# Patient Record
Sex: Female | Born: 1980
Health system: Southern US, Community
[De-identification: ages and names within clinical notes are randomized; demographics above are authoritative.]

## PROBLEM LIST (undated history)

## (undated) DIAGNOSIS — O009 Unspecified ectopic pregnancy without intrauterine pregnancy: Secondary | ICD-10-CM

## (undated) DIAGNOSIS — G44209 Tension-type headache, unspecified, not intractable: Secondary | ICD-10-CM

## (undated) DIAGNOSIS — E782 Mixed hyperlipidemia: Principal | ICD-10-CM

## (undated) DIAGNOSIS — E559 Vitamin D deficiency, unspecified: Secondary | ICD-10-CM

## (undated) HISTORY — PX: NO PAST SURGERIES: SHX2092

## (undated) HISTORY — PX: BREAST SURGERY: SHX581

---

## 2015-01-29 ENCOUNTER — Ambulatory Visit (INDEPENDENT_AMBULATORY_CARE_PROVIDER_SITE_OTHER): Payer: 59 | Admitting: Family Medicine

## 2015-01-29 VITALS — BP 120/70 | HR 86 | Temp 98.6°F | Resp 18 | Ht 62.5 in | Wt 108.1 lb

## 2015-01-29 DIAGNOSIS — R350 Frequency of micturition: Secondary | ICD-10-CM | POA: Diagnosis not present

## 2015-01-29 LAB — POCT URINALYSIS DIPSTICK
Bilirubin, UA: NEGATIVE
GLUCOSE UA: NEGATIVE
Ketones, UA: NEGATIVE
Nitrite, UA: NEGATIVE
PROTEIN UA: NEGATIVE
RBC UA: NEGATIVE
SPEC GRAV UA: 1.015
Urobilinogen, UA: 0.2
pH, UA: 6

## 2015-01-29 LAB — POCT UA - MICROSCOPIC ONLY
Casts, Ur, LPF, POC: NEGATIVE
Crystals, Ur, HPF, POC: NEGATIVE
Mucus, UA: NEGATIVE
Yeast, UA: NEGATIVE

## 2015-01-29 LAB — POCT URINE PREGNANCY: Preg Test, Ur: NEGATIVE

## 2015-01-29 MED ORDER — CIPROFLOXACIN HCL 500 MG PO TABS: 500.0000 mg | ORAL_TABLET | Freq: Two times a day (BID) | ORAL | Status: DC

## 2015-01-29 NOTE — Progress Notes (Addendum)
This chart was scribed for Dr. Elvina Sidle, MD by Jarvis Morgan, Medical Scribe. This patient was seen in Room 11 and the patient's care was started at 2:57 PM.  Patient ID: Grace Powell MRN: [id number], DOB: 1981/06/09, 34 y.o. Date of Encounter: 01/29/2015, 2:52 PM  Primary Physician: No primary care provider on file.  Chief Complaint:  Chief Complaint  Patient presents with   Urinary Tract Infection    C/O urinary frequency & pain x 1 week     HPI: 34 y.o. year old female with history below presents with urinary frequency for 1 week. She is having associated dysuria and suprapubic abdominal pain. Pt is not currently on BC medication. Her LNMP was 01/05/15. She denies any chance she could be pregnant. Pt denies any fever, chills, nausea, vomiting or back pain.  Pt is from [place], [place]. She works in a warehouse  No past medical history on file.   Home Meds: Prior to Admission medications   Not on File    Allergies: No Known Allergies  History   Social History   Marital Status: Single    Spouse Name: N/A   Number of Children: N/A   Years of Education: N/A   Occupational History   Not on file.   Social History Main Topics   Smoking status: Never Smoker    Smokeless tobacco: Not on file   Alcohol Use: No   Drug Use: No   Sexual Activity: Not on file   Other Topics Concern   Not on file   Social History Narrative   No narrative on file     Review of Systems: Constitutional: negative for chills, fever, night sweats, weight changes, or fatigue  HEENT: negative for vision changes, hearing loss, congestion, rhinorrhea, ST, epistaxis, or sinus pressure Cardiovascular: negative for chest pain or palpitations Respiratory: negative for hemoptysis, wheezing, shortness of breath, or cough Abdominal: positive for suprapubic abdominal pain. Negative for nausea, vomiting, diarrhea, or constipation Dermatological: negative for rash Neurologic:  negative for headache, dizziness, or syncope GU: positive for urinary frequency and dysuria All other systems reviewed and are otherwise negative with the exception to those above and in the HPI.   Physical Exam: Blood pressure 120/70, pulse 86, temperature 98.6 F (37 C), temperature source Oral, resp. rate 18, height 5' 2.5" (1.588 m), weight 108 lb 2 oz (49.045 kg), last menstrual period 01/10/2015, SpO2 95 %., Body mass index is 19.45 kg/(m^2). General: Well developed, well nourished, in no acute distress. Head: Normocephalic, atraumatic, eyes without discharge, sclera non-icteric, nares are without discharge. Bilateral auditory canals clear, TM's are without perforation, pearly grey and translucent with reflective cone of light bilaterally. Oral cavity moist, posterior pharynx without exudate, erythema, peritonsillar abscess, or post nasal drip.  Neck: Supple. No thyromegaly. Full ROM. No lymphadenopathy. Lungs: Clear bilaterally to auscultation without wheezes, rales, or rhonchi. Breathing is unlabored. Heart: RRR with S1 S2. No murmurs, rubs, or gallops appreciated. Abdomen: Soft, non-distended with normoactive bowel sounds. Mild suprapubic tenderness. No hepatomegaly. No rebound/guarding. No obvious abdominal masses. Msk:  Strength and tone normal for age. Extremities/Skin: Warm and dry. No clubbing or cyanosis. No edema. No rashes or suspicious lesions. Neuro: Alert and oriented X 3. Moves all extremities spontaneously. Gait is normal. CNII-XII grossly in tact. Psych:  Responds to questions appropriately with a normal affect.   Results for orders placed or performed in visit on 01/29/15  POCT UA - Microscopic Only  Result Value Ref Range  WBC, Ur, HPF, POC 5-10    RBC, urine, microscopic 0-1    Bacteria, U Microscopic 3+    Mucus, UA negative    Epithelial cells, urine per micros 5-10    Crystals, Ur, HPF, POC negative    Casts, Ur, LPF, POC negative    Yeast, UA negative     Amorphous moderate   Urinalysis Dipstick  Result Value Ref Range   Color, UA yellow    Clarity, UA clear    Glucose, UA negative    Bilirubin, UA negative    Ketones, UA negative    Spec Grav, UA 1.015    Blood, UA negative    pH, UA 6.0    Protein, UA negative    Urobilinogen, UA 0.2    Nitrite, UA negative    Leukocytes, UA small (1+) (A) Negative  POCT urine pregnancy  Result Value Ref Range   Preg Test, Ur Negative Negative      ASSESSMENT AND PLAN:  34 y.o. year old female with  1. Urinary frequency    This chart was scribed in my presence and reviewed by me personally.    ICD-9-CM ICD-10-CM   1. Urinary frequency 788.41 R35.0 POCT UA - Microscopic Only     Urinalysis Dipstick     POCT urine pregnancy     Urine culture     ciprofloxacin (CIPRO) 500 MG tablet     Signed, Elvina SidleKurt Lauenstein, MD 01/29/2015 2:52 PM

## 2015-01-29 NOTE — Patient Instructions (Signed)

## 2015-02-01 LAB — URINE CULTURE: Colony Count: >=100000

## 2015-04-15 ENCOUNTER — Ambulatory Visit (INDEPENDENT_AMBULATORY_CARE_PROVIDER_SITE_OTHER): Payer: 59 | Admitting: Physician Assistant

## 2015-04-15 VITALS — BP 102/64 | HR 92 | Temp 98.4°F | Resp 18 | Ht 62.25 in | Wt 109.0 lb

## 2015-04-15 DIAGNOSIS — R0789 Other chest pain: Secondary | ICD-10-CM

## 2015-04-15 DIAGNOSIS — Z23 Encounter for immunization: Secondary | ICD-10-CM | POA: Diagnosis not present

## 2015-04-15 DIAGNOSIS — Z124 Encounter for screening for malignant neoplasm of cervix: Secondary | ICD-10-CM

## 2015-04-15 DIAGNOSIS — G479 Sleep disorder, unspecified: Secondary | ICD-10-CM | POA: Diagnosis not present

## 2015-04-15 DIAGNOSIS — Z0000 Encounter for general adult medical examination without abnormal findings: Secondary | ICD-10-CM

## 2015-04-15 NOTE — Progress Notes (Signed)
Patient ID: Grace Powell, female    DOB: [birth year], 34 y.o.   MRN: [id number]  PCP: No primary care provider on file.  Chief Complaint  Patient presents with  . Annual Exam    with pelvic exam    Subjective:   HPI: Patient presents today for her complete physical exam.   Concerns today include chest pain. She feels a pain in the middle of her chest, over the sternum, and occasionally on the left side of her chest over her heart. It occurs when she "thinks a lot", or is nervous or stressed about something. It last occurred this morning when she was stressed about something. The episodes usually last around 5 minutes. No heart palpitations. No shortness of breath.   She has no history of diabetes, hypertension, or other medical problems.  No family history of MI, stroke, or heart disease.   She is also having trouble sleeping. 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 She has a lot of trouble falling asleep, and this is a problem because she has to be up at 4:30 AM, and does not have much time to sleep as it is. She typically goes to bed about 9 pm and sleep for 90-120 minutes, and then awakens. Then she has difficulty going back to sleep, thinking about all the worries she has.  In addition to a busy schedule, she is worried that she is unmarried and without children at her age. She would like to become pregnant now, and is happy to raise a child alone if her current partner decided he did not want to be involved.  She does not wish to take medication for the problems she describes, she just wants to make sure that everything is okay.    There are no active problems to display for this patient.   History reviewed. No pertinent past medical history.   Prior to Admission medications   Not on File    No Known Allergies  History reviewed. No pertinent past surgical history.  Family History  Problem Relation Age of Onset  . Cancer Father      Social History   Social History  . Marital Status: Single    Spouse Name: N/A  . Number of Children: 0  . Years of Education: N/A   Occupational History  . Warehouse Picker     [employer]   Social History Main Topics  . Smoking status: Never Smoker   . Smokeless tobacco: None  . Alcohol Use: No  . Drug Use: No  . Sexual Activity: Not Asked   Other Topics Concern  . None   Social History Narrative   Patient is from [place]. She came to the [place] in 2013. Her family remains in [place]. She lives at home with her cousin.       Review of Systems  Constitutional: Positive for fatigue. Negative for fever, chills, diaphoresis, activity change, appetite change and unexpected weight change.  HENT: Negative for congestion, sinus pressure, sneezing, sore throat and trouble swallowing.   Eyes: Negative.   Respiratory: Negative.   Cardiovascular: Positive for chest pain. Negative for palpitations and leg swelling.  Gastrointestinal: Negative.   Endocrine: Negative.   Genitourinary: Negative.   Musculoskeletal: Positive for arthralgias (sometimes has foot pain). Negative for myalgias, back pain and joint swelling.  Skin: Negative.   Allergic/Immunologic: Negative.   Neurological: Positive for headaches (occasionally). Negative for dizziness, tremors,  seizures, syncope, facial asymmetry, speech difficulty, weakness, light-headedness and numbness.  Hematological: Negative.   Psychiatric/Behavioral: Positive for sleep disturbance. Negative for suicidal ideas, hallucinations, behavioral problems, confusion, self-injury, dysphoric mood, decreased concentration and agitation. The patient is not nervous/anxious and is not hyperactive.         Objective:  Physical Exam  Constitutional: She is oriented to person, place, and time. Vital signs are normal. She appears well-developed and well-nourished. She is active and cooperative. No distress.  BP 102/64 mmHg  Pulse  92  Temp(Src) 98.4 F (36.9 C) (Oral)  Resp 18  Ht 5' 2.25" (1.581 m)  Wt 109 lb (49.442 kg)  BMI 19.78 kg/m2  SpO2 98%  LMP 04/06/2015   HENT:  Head: Normocephalic and atraumatic.  Right Ear: Hearing, tympanic membrane, external ear and ear canal normal. No foreign bodies.  Left Ear: Hearing, tympanic membrane, external ear and ear canal normal. No foreign bodies.  Nose: Nose normal.  Mouth/Throat: Uvula is midline, oropharynx is clear and moist and mucous membranes are normal. No oral lesions. Normal dentition. No dental abscesses or uvula swelling. No oropharyngeal exudate.  Eyes: Conjunctivae, EOM and lids are normal. Pupils are equal, round, and reactive to light. Right eye exhibits no discharge. Left eye exhibits no discharge. No scleral icterus.  Fundoscopic exam:      The right eye shows no arteriolar narrowing, no AV nicking, no exudate, no hemorrhage and no papilledema.       The left eye shows no arteriolar narrowing, no AV nicking, no exudate, no hemorrhage and no papilledema.  Neck: Trachea normal, normal range of motion and full passive range of motion without pain. Neck supple. No spinous process tenderness and no muscular tenderness present. No thyroid mass and no thyromegaly present.  Cardiovascular: Normal rate, regular rhythm, normal heart sounds, intact distal pulses and normal pulses.   Pulmonary/Chest: Effort normal and breath sounds normal. She exhibits no tenderness and no retraction. Right breast exhibits no inverted nipple, no mass, no nipple discharge, no skin change and no tenderness. Left breast exhibits no inverted nipple, no mass, no nipple discharge, no skin change and no tenderness. Breasts are symmetrical.  Abdominal: Soft. Normal appearance and bowel sounds are normal. She exhibits no distension and no mass. There is no hepatosplenomegaly. There is no tenderness. There is no rigidity, no rebound, no guarding, no CVA tenderness, no tenderness at McBurney's  point and negative Murphy's sign. No hernia. Hernia confirmed negative in the right inguinal area and confirmed negative in the left inguinal area.  Genitourinary: Rectum normal, vagina normal and uterus normal. Rectal exam shows no external hemorrhoid and no fissure. No breast swelling, tenderness, discharge or bleeding. Pelvic exam was performed with patient supine. No labial fusion. There is no rash, tenderness, lesion or injury on the right labia. There is no rash, tenderness, lesion or injury on the left labia. Cervix exhibits no motion tenderness, no discharge and no friability. Right adnexum displays no mass, no tenderness and no fullness. Left adnexum displays no mass, no tenderness and no fullness. No erythema, tenderness or bleeding in the vagina. No foreign body around the vagina. No signs of injury around the vagina. No vaginal discharge found.  Musculoskeletal: She exhibits no edema or tenderness.       Cervical back: Normal.       Thoracic back: Normal.       Lumbar back: Normal.  Lymphadenopathy:       Head (right side): No tonsillar, no preauricular,  no posterior auricular and no occipital adenopathy present.       Head (left side): No tonsillar, no preauricular, no posterior auricular and no occipital adenopathy present.    She has no cervical adenopathy.    She has no axillary adenopathy.       Right: No inguinal and no supraclavicular adenopathy present.       Left: No inguinal and no supraclavicular adenopathy present.  Neurological: She is alert and oriented to person, place, and time. She has normal strength and normal reflexes. No cranial nerve deficit. She exhibits normal muscle tone. Coordination and gait normal.  Skin: Skin is warm, dry and intact. No rash noted. She is not diaphoretic. No cyanosis or erythema. Nails show no clubbing.  Psychiatric: She has a normal mood and affect. Her speech is normal and behavior is normal. Judgment and thought content normal.            Assessment & Plan:  1. Annual physical exam Age appropriate anticipatory guidance provided.  2. Need for Tdap vaccination - Tdap vaccine greater than or equal to 7yo IM  3. Screening for cervical cancer If both cytology and HPV are negative, repeat both in 5 years. - Pap IG and HPV (high risk) DNA detection  4. Sleep disturbance Discussed ways to relax, reminding her that she is inherently valuable, regardless of her marital status or motherhood.  5. Chest discomfort This appears to be anxiety related, like with the sleep disturbance. Doubt this is cardiac in nature, and she is reassured. However, if it continues, she is to RTC for additional evaluation.  Attempt to screen for anemia, metabloic disorder, HIV and check TSH failed due to unsuccessful phlebotomy.   Fernande Bras, PA-C Physician Assistant-Certified [facility]

## 2015-04-15 NOTE — Progress Notes (Signed)
Subjective:     Patient ID: Grace Powell, female   DOB: 1980/11/27, 34 y.o.   MRN: [id number] PCP: No primary care provider on file.  Chief Complaint  Patient presents with  . Annual Exam    with pelvic exam    HPI Patient presents today for her complete physical exam.   Concerns today include chest pain. She feels a pain in the middle of her chest, over the sternum, and occasionally on the left side of her chest over her heart. It occurs when she "thinks a lot", or is nervous or stressed about something. It last occurred this morning when she was stressed about something. The episodes usually last around 5 minutes. No heart palpitations. No shortness of breath.  She has no history of diabetes, hypertension, or other medical problems.  No family history of MI, stroke, or heart disease.   She is also having trouble sleeping. 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 She has a lot of trouble falling asleep, and this is a problem because she goes to bed at 11 pm and has to be up at 4:30 AM, and does not have much time to sleep as it is.   She does not wish to take medication, she just wants to make sure that everything is okay.   Review of Systems  Constitutional: Negative for fever and chills.  Eyes: Negative for visual disturbance.  Respiratory: Positive for chest tightness. Negative for shortness of breath.   Cardiovascular: Negative for chest pain.  Gastrointestinal: Negative for nausea, vomiting, abdominal pain, diarrhea and constipation.  Genitourinary: Negative for dysuria, urgency and frequency.  Musculoskeletal: Negative for arthralgias.       Foot pain, mainly after work (she works in a warehouse) but sometimes in the morning.  Skin: Negative for rash.  Neurological: Positive for headaches (Occasional). Negative for dizziness.  Psychiatric/Behavioral: Negative for sleep disturbance.     There are no active problems to display for  this patient.   Prior to Admission medications   Not on File    No Known Allergies    Objective:  Physical Exam  Constitutional: She is oriented to person, place, and time. She appears well-developed and well-nourished.  HENT:  Head: Normocephalic.  Right Ear: Tympanic membrane, external ear and ear canal normal.  Left Ear: Tympanic membrane, external ear and ear canal normal.  Nose: Nose normal.  Mouth/Throat: Uvula is midline, oropharynx is clear and moist and mucous membranes are normal.  Eyes: Pupils are equal, round, and reactive to light.  Neck: Normal range of motion. Neck supple.  Cardiovascular: Normal rate and regular rhythm.   Pulmonary/Chest: Effort normal and breath sounds normal.  Neurological: She is alert and oriented to person, place, and time. She has normal reflexes.  Skin: Skin is warm and dry.  Psychiatric: She has a normal mood and affect. Her behavior is normal. Thought content normal.     BP 102/64 mmHg  Pulse 92  Temp(Src) 98.4 F (36.9 C) (Oral)  Resp 18  Ht 5' 2.25" (1.581 m)  Wt 109 lb (49.442 kg)  BMI 19.78 kg/m2  SpO2 98%  LMP 04/06/2015   Assessment & Plan:  1. Annual physical exam No abnormal findings.   2. Need for Tdap vaccination - Tdap vaccine greater than or equal to 7yo IM  3. Screening for cervical cancer - Pap IG and HPV (high risk) DNA detection  4. Sleep disturbance This is likely  anxiety related. Counseled patient to try to decrease worrying! She will RTC if she decides she would like medication to help her sleep.   5. Chest discomfort Likely anxiety related. She is to RTC if her symptoms do not get better or worsen.   We were unable to obtain blood specimens for routine labs (CBC, CMET, HIV, Lipid panel, TSH). We will try again at a later date, especially if her symptoms of insomnia/chest discomfort continue.    Amber D. Race, PA-S Physician Assistant Student [facility]

## 2015-04-15 NOTE — Patient Instructions (Signed)
Keeping You Healthy  Get These Tests 1. Blood Pressure- Have your blood pressure checked once a year by your health care provider.  Normal blood pressure is 120/80. 2. Weight- Have your body mass index (BMI) calculated to screen for obesity.  BMI is measure of body fat based on height and weight.  You can also calculate your own BMI at https://www.west-esparza.com/. 3. Cholesterol- Have your cholesterol checked every 5 years starting at age 35 then yearly starting at age 72. 4. Chlamydia, HIV, and other sexually transmitted diseases- Get screened every year until age 78, then within three months of each new sexual provider. 5. Pap Test - Every 1-5 years; discuss with your health care provider. 6. Mammogram- Every 1-2 years starting at age 76--50  Take these medicines  Calcium with Vitamin D-Your body needs 1200 mg of Calcium each day and [phone] IU of Vitamin D daily.  Your body can only absorb 500 mg of Calcium at a time so Calcium must be taken in 2 or 3 divided doses throughout the day.  Multivitamin with folic acid- Once daily if it is possible for you to become pregnant.  Get these Immunizations  Gardasil-Series of three doses; prevents HPV related illness such as genital warts and cervical cancer.  Menactra-Single dose; prevents meningitis.  Tetanus shot- Every 10 years.  Flu shot-Every year.  Take these steps 1. Do not smoke-Your healthcare provider can help you quit.  For tips on how to quit go to www.smokefree.gov or call 1-800 QUITNOW. 2. Be physically active- Exercise 5 days a week for at least 30 minutes.  If you are not already physically active, start slow and gradually work up to 30 minutes of moderate physical activity.  Examples of moderate activity include walking briskly, dancing, swimming, bicycling, etc. 3. Breast Cancer- A self breast exam every month is important for early detection of breast cancer.  For more information and instruction on self breast exams, ask your  healthcare provider or [place]. 4. Eat a healthy diet- Eat a variety of healthy foods such as fruits, vegetables, whole grains, low fat milk, low fat cheeses, yogurt, lean meats, poultry and fish, beans, nuts, tofu, etc.  For more information go to www. Thenutritionsource.org 5. Drink alcohol in moderation- Limit alcohol intake to one drink or less per day. Never drink and drive. 6. Depression- Your emotional health is as important as your physical health.  If you're feeling down or losing interest in things you normally enjoy please talk to your healthcare provider about being screened for depression. 7. Dental visit- Brush and floss your teeth twice daily; visit your dentist twice a year. 8. Eye doctor- Get an eye exam at least every 2 years. 9. Helmet use- Always wear a helmet when riding a bicycle, motorcycle, rollerblading or skateboarding. 10. Safe sex- If you may be exposed to sexually transmitted infections, use a condom. 11. Seat belts- Seat belts can save your live; always wear one. 12. Smoke/Carbon Monoxide detectors- These detectors need to be installed on the appropriate level of your home. Replace batteries at least once a year. 13. Skin cancer- When out in the sun please cover up and use sunscreen 15 SPF or higher. 14. Violence- If anyone is threatening or hurting you, please tell your healthcare provider.  We will let you know the results of your lab tests as soon as they are available.

## 2015-04-19 LAB — PAP IG AND HPV HIGH-RISK: HPV DNA HIGH RISK: NOT DETECTED

## 2015-04-21 ENCOUNTER — Encounter: Payer: Self-pay | Admitting: Physician Assistant

## 2017-01-04 ENCOUNTER — Encounter: Payer: Self-pay | Admitting: Family Medicine

## 2017-01-04 ENCOUNTER — Ambulatory Visit (INDEPENDENT_AMBULATORY_CARE_PROVIDER_SITE_OTHER): Payer: BLUE CROSS/BLUE SHIELD | Admitting: Family Medicine

## 2017-01-04 VITALS — BP 129/69 | HR 91 | Temp 98.3°F | Resp 16 | Ht 62.25 in | Wt 113.2 lb

## 2017-01-04 DIAGNOSIS — R300 Dysuria: Secondary | ICD-10-CM | POA: Diagnosis not present

## 2017-01-04 LAB — POCT URINALYSIS DIP (MANUAL ENTRY)
BILIRUBIN UA: NEGATIVE mg/dL
Bilirubin, UA: NEGATIVE
Glucose, UA: NEGATIVE mg/dL
LEUKOCYTES UA: NEGATIVE
NITRITE UA: NEGATIVE
PH UA: 6 (ref 5.0–8.0)
PROTEIN UA: NEGATIVE mg/dL
Spec Grav, UA: 1.01 (ref 1.010–1.025)
UROBILINOGEN UA: 0.2 U/dL

## 2017-01-04 MED ORDER — PHENAZOPYRIDINE HCL 100 MG PO TABS: 100.0000 mg | ORAL_TABLET | Freq: Three times a day (TID) | ORAL | 0 refills | Status: DC | PRN

## 2017-01-04 NOTE — Patient Instructions (Addendum)
   IF you received an x-ray today, you will receive an invoice from [facility]. Please contact [facility] at [phone] with questions or concerns regarding your invoice.   IF you received labwork today, you will receive an invoice from [facility]. Please contact [facility] at [phone] with questions or concerns regarding your invoice.   Our billing staff will not be able to assist you with questions regarding bills from these companies.  You will be contacted with the lab results as soon as they are available. The fastest way to get your results is to activate your My Chart account. Instructions are located on the last page of this paperwork. If you have not heard from us regarding the results in 2 weeks, please contact this office.    Dysuria Dysuria is pain or discomfort while urinating. The pain or discomfort may be felt in the tube that carries urine out of the bladder (urethra) or in the surrounding tissue of the genitals. The pain may also be felt in the groin area, lower abdomen, and lower back. You may have to urinate frequently or have the sudden feeling that you have to urinate (urgency). Dysuria can affect both men and women, but is more common in women. Dysuria can be caused by many different things, including:  Urinary tract infection in women.  Infection of the kidney or bladder.  Kidney stones or bladder stones.  Certain sexually transmitted infections (STIs), such as chlamydia.  Dehydration.  Inflammation of the vagina.  Use of certain medicines.  Use of certain soaps or scented products that cause irritation.  Follow these instructions at home: Watch your dysuria for any changes. The following actions may help to reduce any discomfort you are feeling:  Drink enough fluid to keep your urine clear or pale yellow.  Empty your bladder often. Avoid holding urine for long periods of time.  After a bowel movement or urination, women should  cleanse from front to back, using each tissue only once.  Empty your bladder after sexual intercourse.  Take medicines only as directed by your health care provider.  If you were prescribed an antibiotic medicine, finish it all even if you start to feel better.  Avoid caffeine, tea, and alcohol. They can irritate the bladder and make dysuria worse. In men, alcohol may irritate the prostate.  Keep all follow-up visits as directed by your health care provider. This is important.  If you had any tests done to find the cause of dysuria, it is your responsibility to obtain your test results. Ask the lab or department performing the test when and how you will get your results. Talk with your health care provider if you have any questions about your results.  Contact a health care provider if:  You develop pain in your back or sides.  You have a fever.  You have nausea or vomiting.  You have blood in your urine.  You are not urinating as often as you usually do. Get help right away if:  You pain is severe and not relieved with medicines.  You are unable to hold down any fluids.  You or someone else notices a change in your mental function.  You have a rapid heartbeat at rest.  You have shaking or chills.  You feel extremely weak. This information is not intended to replace advice given to you by your health care provider. Make sure you discuss any questions you have with your health care provider. Document Released: 03/30/2004 Document   Revised: 12/08/2015 Document Reviewed: 02/25/2014 Elsevier Interactive Patient Education  2018 Elsevier Inc.  

## 2017-01-04 NOTE — Progress Notes (Signed)
  Chief Complaint  Patient presents with  . Dysuria    x 3 days    HPI  Patient reports that she has been having burning with urination  She reports that she also has urinary frquency every 10 minutes She states that she has been drinking plenty of water and drinks her water mostly at night With increasing water intake she has less burning She reports some nausea and flank pain Patient's last menstrual period was 12/16/2016.   She reports that she has a family history of diabetes but does not have any symptoms of weight gain She reports that her sugar tends to run low  No past medical history on file.  Current Outpatient Prescriptions  Medication Sig Dispense Refill  . phenazopyridine (PYRIDIUM) 100 MG tablet Take 1 tablet (100 mg total) by mouth 3 (three) times daily as needed for pain. 9 tablet 0   No current facility-administered medications for this visit.     Allergies: No Known Allergies  No past surgical history on file.  Social History   Social History  . Marital status: Single    Spouse name: N/A  . Number of children: 0  . 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 Smoking status: Never Smoker  . Smokeless tobacco: Never Used  . Alcohol use No  . Drug use: No  . Sexual activity: Not Asked   Other Topics Concern  . None   Social History Narrative   Patient is from [place]. She came [place] in [place]. Her family remains in [place]. She lives at home with her cousin.    ROS  Objective: Vitals:   01/04/17 1403  BP: 129/69  Pulse: 91  Resp: 16  Temp: 98.3 F (36.8 C)  TempSrc: Oral  SpO2: 100%  Weight: 113 lb 3.2 oz (51.3 kg)  Height: 5' 2.25" (1.581 m)    Physical Exam  Assessment and Plan Shemeca was seen today for dysuria.  Diagnoses and all orders for this visit:  Dysuria -     POCT urinalysis dipstick -     Urine Culture  Other  orders -     phenazopyridine (PYRIDIUM) 100 MG tablet; Take 1 tablet (100 mg total) by mouth 3 (three) times daily as needed for pain.  ua no LE or nit Urine culture neg Symptomatic rx with pyridium   Phyllis Whitefield A Creta LevinStallings

## 2017-01-05 LAB — URINE CULTURE

## 2017-01-15 ENCOUNTER — Encounter: Payer: Self-pay | Admitting: Family Medicine

## 2017-01-15 ENCOUNTER — Ambulatory Visit (INDEPENDENT_AMBULATORY_CARE_PROVIDER_SITE_OTHER): Payer: BLUE CROSS/BLUE SHIELD | Admitting: Family Medicine

## 2017-01-15 VITALS — BP 111/69 | HR 83 | Temp 98.0°F | Resp 16 | Ht 62.25 in | Wt 111.2 lb

## 2017-01-15 DIAGNOSIS — R300 Dysuria: Secondary | ICD-10-CM

## 2017-01-15 DIAGNOSIS — N3001 Acute cystitis with hematuria: Secondary | ICD-10-CM | POA: Diagnosis not present

## 2017-01-15 LAB — POCT URINALYSIS DIP (MANUAL ENTRY)
BILIRUBIN UA: NEGATIVE
GLUCOSE UA: NEGATIVE mg/dL
Ketones, POC UA: NEGATIVE mg/dL
NITRITE UA: NEGATIVE
Protein Ur, POC: 30 mg/dL — AB
Spec Grav, UA: 1.02 (ref 1.010–1.025)
UROBILINOGEN UA: 0.2 U/dL
pH, UA: 6 (ref 5.0–8.0)

## 2017-01-15 MED ORDER — CIPROFLOXACIN HCL 250 MG PO TABS: 250.0000 mg | ORAL_TABLET | Freq: Two times a day (BID) | ORAL | 0 refills | Status: AC

## 2017-01-15 NOTE — Patient Instructions (Addendum)
[facility] Address: [address], [place], [place] [address]  Hours:  Phone: [phone]     IF you received an x-ray today, you will receive an invoice from [facility]. Please contact [facility] at [phone] with questions or concerns regarding your invoice.   IF you received labwork today, you will receive an invoice from [facility]. Please contact [facility] at [phone] with questions or concerns regarding your invoice.   Our billing staff will not be able to assist you with questions regarding bills from these companies.  You will be contacted with the lab results as soon as they are available. The fastest way to get your results is to activate your My Chart account. Instructions are located on the last page of this paperwork. If you have not heard from us regarding the results in 2 weeks, please contact this office.     Urinary Tract Infection, Adult A urinary tract infection (UTI) is an infection of any part of the urinary tract. The urinary tract includes the:  Kidneys.  Ureters.  Bladder.  Urethra.  These organs make, store, and get rid of pee (urine) in the body. Follow these instructions at home:  Take over-the-counter and prescription medicines only as told by your doctor.  If you were prescribed an antibiotic medicine, take it as told by your doctor. Do not stop taking the antibiotic even if you start to feel better.  Avoid the following drinks: ? Alcohol. ? Caffeine. ? Tea. ? Carbonated drinks.  Drink enough fluid to keep your pee clear or pale yellow.  Keep all follow-up visits as told by your doctor. This is important.  Make sure to: ? Empty your bladder often and completely. Do not to hold pee for long periods of time. ? Empty your bladder before and after sex. ? Wipe from front to back after a bowel movement if you are female. Use each tissue one time when you wipe. Contact a doctor if:  You have back  pain.  You have a fever.  You feel sick to your stomach (nauseous).  You throw up (vomit).  Your symptoms do not get better after 3 days.  Your symptoms go away and then come back. Get help right away if:  You have very bad back pain.  You have very bad lower belly (abdominal) pain.  You are throwing up and cannot keep down any medicines or water. This information is not intended to replace advice given to you by your health care provider. Make sure you discuss any questions you have with your health care provider. Document Released: 12/19/2007 Document Revised: 12/08/2015 Document Reviewed: 05/23/2015 Elsevier Interactive Patient Education  [employer]2018 Elsevier Inc.

## 2017-01-15 NOTE — Progress Notes (Signed)
  Chief Complaint  Patient presents with  . Urinary Tract Infection    Possible uti, initially seen for dysuria on 6/22, per pt it is not as bad as it was but she can still feel it and she goes to urinate approx. 3-4 time during the night.    HPI  Pt reports that she continues to have dysuria She was seen on 01/04/17 and LE and nitrites were absent from her UA but her symptoms were concerning for UTI. She was treated with pyridium and urine culture was sent. Urine culture showed mixed flora Today she has continued urinary urgency and frequency with dysuria She states that she has some chills as well   History reviewed. No pertinent past medical history.  Current Outpatient Prescriptions  Medication Sig Dispense Refill  . phenazopyridine (PYRIDIUM) 100 MG tablet Take 1 tablet (100 mg total) by mouth 3 (three) times daily as needed for pain. 9 tablet 0   No current facility-administered medications for this visit.     Allergies: No Known Allergies  History reviewed. No pertinent surgical history.  Social History   Social History  . Marital status: Single    Spouse name: N/A  . Number of children: 0  . 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 Smoking status: Never Smoker  . Smokeless tobacco: Never Used  . Alcohol use No  . Drug use: No  . Sexual activity: Not Asked   Other Topics Concern  . None   Social History Narrative   Patient is from [place]. [place] came [place] in [place]. Her family remains in [place]. She lives at home with her cousin.    ROS  See hpi   Objective: Vitals:   01/15/17 1046  BP: 111/69  Pulse: 83  Resp: 16  Temp: 98 F (36.7 C)  TempSrc: Oral  SpO2: 99%  Weight: 111 lb 3.2 oz (50.4 kg)  Height: 5' 2.25" (1.581 m)    Physical Exam  Constitutional: She appears well-developed and well-nourished.  HENT:  Head: Normocephalic and atraumatic.    Eyes: Conjunctivae and EOM are normal.  Cardiovascular: Normal rate, regular rhythm and normal heart sounds.   Pulmonary/Chest: Effort normal and breath sounds normal. No respiratory distress. She has no wheezes.  Abdominal: Soft. Bowel sounds are normal. She exhibits no distension. There is no tenderness.  No suprapubic tenderness or flank pain     Ref Range & Units 11:10  Color, UA yellow yellow   Clarity, UA clear cloudy    Glucose, UA negative mg/dL negative   Bilirubin, UA negative negative   Ketones, POC UA negative mg/dL negative   Spec Grav, UA 1.010 - 1.025 1.020   Blood, UA negative large    pH, UA 5.0 - 8.0 6.0   Protein Ur, POC negative mg/dL =16=30    Urobilinogen, UA 0.2 or 1.0 E.U./dL 0.2   Nitrite, UA Negative Negative   Leukocytes, UA Negative Moderate (2+)      Specimen Collected: 01/15/17 11:10 Last Resulted: 01/15/17 11:10       Assessment and Plan Sharnelle was seen today for urinary tract infection.  Diagnoses and all orders for this visit:  Dysuria -     POCT urinalysis dipstick     Jaceyon Strole A Creta LevinStallings

## 2017-06-18 ENCOUNTER — Ambulatory Visit (INDEPENDENT_AMBULATORY_CARE_PROVIDER_SITE_OTHER): Payer: BLUE CROSS/BLUE SHIELD | Admitting: Physician Assistant

## 2017-06-18 ENCOUNTER — Encounter: Payer: Self-pay | Admitting: Physician Assistant

## 2017-06-18 ENCOUNTER — Other Ambulatory Visit: Payer: Self-pay

## 2017-06-18 VITALS — BP 122/76 | HR 95 | Temp 98.0°F | Resp 18 | Ht 63.94 in | Wt 114.4 lb

## 2017-06-18 DIAGNOSIS — N898 Other specified noninflammatory disorders of vagina: Secondary | ICD-10-CM

## 2017-06-18 DIAGNOSIS — Z0000 Encounter for general adult medical examination without abnormal findings: Secondary | ICD-10-CM | POA: Diagnosis not present

## 2017-06-18 DIAGNOSIS — Z13220 Encounter for screening for lipoid disorders: Secondary | ICD-10-CM

## 2017-06-18 DIAGNOSIS — Z1329 Encounter for screening for other suspected endocrine disorder: Secondary | ICD-10-CM

## 2017-06-18 DIAGNOSIS — Z130 Encounter for screening for diseases of the blood and blood-forming organs and certain disorders involving the immune mechanism: Secondary | ICD-10-CM

## 2017-06-18 DIAGNOSIS — Z114 Encounter for screening for human immunodeficiency virus [HIV]: Secondary | ICD-10-CM

## 2017-06-18 DIAGNOSIS — Z113 Encounter for screening for infections with a predominantly sexual mode of transmission: Secondary | ICD-10-CM | POA: Diagnosis not present

## 2017-06-18 DIAGNOSIS — Z1389 Encounter for screening for other disorder: Secondary | ICD-10-CM | POA: Diagnosis not present

## 2017-06-18 DIAGNOSIS — Z13228 Encounter for screening for other metabolic disorders: Secondary | ICD-10-CM | POA: Diagnosis not present

## 2017-06-18 DIAGNOSIS — R109 Unspecified abdominal pain: Secondary | ICD-10-CM

## 2017-06-18 DIAGNOSIS — N979 Female infertility, unspecified: Secondary | ICD-10-CM | POA: Diagnosis not present

## 2017-06-18 LAB — POCT URINALYSIS DIP (MANUAL ENTRY)
BILIRUBIN UA: NEGATIVE
Blood, UA: NEGATIVE
GLUCOSE UA: NEGATIVE mg/dL
Ketones, POC UA: NEGATIVE mg/dL
LEUKOCYTES UA: NEGATIVE
NITRITE UA: NEGATIVE
Protein Ur, POC: NEGATIVE mg/dL
Spec Grav, UA: 1.025 (ref 1.010–1.025)
Urobilinogen, UA: 0.2 E.U./dL
pH, UA: 5.5 (ref 5.0–8.0)

## 2017-06-18 NOTE — Patient Instructions (Addendum)
Your physical exam was completely normal today.  Keep up the good work.  I do recommend starting to increase your structured exercise.  Start out with 10 minutes every few days and increase as tolerated.  In terms of fertility issues, I have given you a referral to gynecology.  They should contact you within 1-2 weeks with an appointment.  Please follow-up as needed.   Health Maintenance, Female Adopting a healthy lifestyle and getting preventive care can go a long way to promote health and wellness. Talk with your health care provider about what schedule of regular examinations is right for you. This is a good chance for you to check in with your provider about disease prevention and staying healthy. In between checkups, there are plenty of things you can do on your own. Experts have done a lot of research about which lifestyle changes and preventive measures are most likely to keep you healthy. Ask your health care provider for more information. Weight and diet Eat a healthy diet  Be sure to include plenty of vegetables, fruits, low-fat dairy products, and lean protein.  Do not eat a lot of foods high in solid fats, added sugars, or salt.  Get regular exercise. This is one of the most important things you can do for your health. ? Most adults should exercise for at least 150 minutes each week. The exercise should increase your heart rate and make you sweat (moderate-intensity exercise). ? Most adults should also do strengthening exercises at least twice a week. This is in addition to the moderate-intensity exercise.  Maintain a healthy weight  Body mass index (BMI) is a measurement that can be used to identify possible weight problems. It estimates body fat based on height and weight. Your health care provider can help determine your BMI and help you achieve or maintain a healthy weight.  For females 37 years of age and older: ? A BMI below 18.5 is considered underweight. ? A BMI of 18.5 to  24.9 is normal. ? A BMI of 25 to 29.9 is considered overweight. ? A BMI of 30 and above is considered obese.  Watch levels of cholesterol and blood lipids  You should start having your blood tested for lipids and cholesterol at 37 years of age, then have this test every 5 years.  You may need to have your cholesterol levels checked more often if: ? Your lipid or cholesterol levels are high. ? You are older than 37 years of age. ? You are at high risk for heart disease.  Cancer screening Lung Cancer  Lung cancer screening is recommended for adults 75-6 years old who are at high risk for lung cancer because of a history of smoking.  A yearly low-dose CT scan of the lungs is recommended for people who: ? Currently smoke. ? Have quit within the past 15 years. ? Have at least a 30-pack-year history of smoking. A pack year is smoking an average of one pack of cigarettes a day for 1 year.  Yearly screening should continue until it has been 15 years since you quit.  Yearly screening should stop if you develop a health problem that would prevent you from having lung cancer treatment.  Breast Cancer  Practice breast self-awareness. This means understanding how your breasts normally appear and feel.  It also means doing regular breast self-exams. Let your health care provider know about any changes, no matter how small.  If you are in your 20s or 30s, you should  have a clinical breast exam (CBE) by a health care provider every 1-3 years as part of a regular health exam.  If you are 18 or older, have a CBE every year. Also consider having a breast X-ray (mammogram) every year.  If you have a family history of breast cancer, talk to your health care provider about genetic screening.  If you are at high risk for breast cancer, talk to your health care provider about having an MRI and a mammogram every year.  Breast cancer gene (BRCA) assessment is recommended for women who have family  members with BRCA-related cancers. BRCA-related cancers include: ? Breast. ? Ovarian. ? Tubal. ? Peritoneal cancers.  Results of the assessment will determine the need for genetic counseling and BRCA1 and BRCA2 testing.  Cervical Cancer Your health care provider may recommend that you be screened regularly for cancer of the pelvic organs (ovaries, uterus, and vagina). This screening involves a pelvic examination, including checking for microscopic changes to the surface of your cervix (Pap test). You may be encouraged to have this screening done every 3 years, beginning at age 40.  For women ages 37-65, health care providers may recommend pelvic exams and Pap testing every 3 years, or they may recommend the Pap and pelvic exam, combined with testing for human papilloma virus (HPV), every 5 years. Some types of HPV increase your risk of cervical cancer. Testing for HPV may also be done on women of any age with unclear Pap test results.  Other health care providers may not recommend any screening for nonpregnant women who are considered low risk for pelvic cancer and who do not have symptoms. Ask your health care provider if a screening pelvic exam is right for you.  If you have had past treatment for cervical cancer or a condition that could lead to cancer, you need Pap tests and screening for cancer for at least 20 years after your treatment. If Pap tests have been discontinued, your risk factors (such as having a new sexual partner) need to be reassessed to determine if screening should resume. Some women have medical problems that increase the chance of getting cervical cancer. In these cases, your health care provider may recommend more frequent screening and Pap tests.  Colorectal Cancer  This type of cancer can be detected and often prevented.  Routine colorectal cancer screening usually begins at 37 years of age and continues through 37 years of age.  Your health care provider may  recommend screening at an earlier age if you have risk factors for colon cancer.  Your health care provider may also recommend using home test kits to check for hidden blood in the stool.  A small camera at the end of a tube can be used to examine your colon directly (sigmoidoscopy or colonoscopy). This is done to check for the earliest forms of colorectal cancer.  Routine screening usually begins at age 47.  Direct examination of the colon should be repeated every 5-10 years through 37 years of age. However, you may need to be screened more often if early forms of precancerous polyps or small growths are found.  Skin Cancer  Check your skin from head to toe regularly.  Tell your health care provider about any new moles or changes in moles, especially if there is a change in a mole's shape or color.  Also tell your health care provider if you have a mole that is larger than the size of a pencil eraser.  Always  use sunscreen. Apply sunscreen liberally and repeatedly throughout the day.  Protect yourself by wearing long sleeves, pants, a wide-brimmed hat, and sunglasses whenever you are outside.  Heart disease, diabetes, and high blood pressure  High blood pressure causes heart disease and increases the risk of stroke. High blood pressure is more likely to develop in: ? People who have blood pressure in the high end of the normal range (130-139/85-89 mm Hg). ? People who are overweight or obese. ? People who are African American.  If you are 62-58 years of age, have your blood pressure checked every 3-5 years. If you are 52 years of age or older, have your blood pressure checked every year. You should have your blood pressure measured twice-once when you are at a hospital or clinic, and once when you are not at a hospital or clinic. Record the average of the two measurements. To check your blood pressure when you are not at a hospital or clinic, you can use: ? An automated blood pressure  machine at a pharmacy. ? A home blood pressure monitor.  If you are between 74 years and 46 years old, ask your health care provider if you should take aspirin to prevent strokes.  Have regular diabetes screenings. This involves taking a blood sample to check your fasting blood sugar level. ? If you are at a normal weight and have a low risk for diabetes, have this test once every three years after 37 years of age. ? If you are overweight and have a high risk for diabetes, consider being tested at a younger age or more often. Preventing infection Hepatitis B  If you have a higher risk for hepatitis B, you should be screened for this virus. You are considered at high risk for hepatitis B if: ? You were born in a country where hepatitis B is common. Ask your health care provider which countries are considered high risk. ? Your parents were born in a high-risk country, and you have not been immunized against hepatitis B (hepatitis B vaccine). ? You have HIV or AIDS. ? You use needles to inject street drugs. ? You live with someone who has hepatitis B. ? You have had sex with someone who has hepatitis B. ? You get hemodialysis treatment. ? You take certain medicines for conditions, including cancer, organ transplantation, and autoimmune conditions.  Hepatitis C  Blood testing is recommended for: ? Everyone born from [birth year] through 1966. ? Anyone with known risk factors for hepatitis C.  Sexually transmitted infections (STIs)  You should be screened for sexually transmitted infections (STIs) including gonorrhea and chlamydia if: ? You are sexually active and are younger than 37 years of age. ? You are older than 37 years of age and your health care provider tells you that you are at risk for this type of infection. ? Your sexual activity has changed since you were last screened and you are at an increased risk for chlamydia or gonorrhea. Ask your health care provider if you are at  risk.  If you do not have HIV, but are at risk, it may be recommended that you take a prescription medicine daily to prevent HIV infection. This is called pre-exposure prophylaxis (PrEP). You are considered at risk if: ? You are sexually active and do not regularly use condoms or know the HIV status of your partner(s). ? You take drugs by injection. ? You are sexually active with a partner who has HIV.  Talk with your  health care provider about whether you are at high risk of being infected with HIV. If you choose to begin PrEP, you should first be tested for HIV. You should then be tested every 3 months for as long as you are taking PrEP. Pregnancy  If you are premenopausal and you may become pregnant, ask your health care provider about preconception counseling.  If you may become pregnant, take 400 to 800 micrograms (mcg) of folic acid every day.  If you want to prevent pregnancy, talk to your health care provider about birth control (contraception). Osteoporosis and menopause  Osteoporosis is a disease in which the bones lose minerals and strength with aging. This can result in serious bone fractures. Your risk for osteoporosis can be identified using a bone density scan.  If you are 66 years of age or older, or if you are at risk for osteoporosis and fractures, ask your health care provider if you should be screened.  Ask your health care provider whether you should take a calcium or vitamin D supplement to lower your risk for osteoporosis.  Menopause may have certain physical symptoms and risks.  Hormone replacement therapy may reduce some of these symptoms and risks. Talk to your health care provider about whether hormone replacement therapy is right for you. Follow these instructions at home:  Schedule regular health, dental, and eye exams.  Stay current with your immunizations.  Do not use any tobacco products including cigarettes, chewing tobacco, or electronic  cigarettes.  If you are pregnant, do not drink alcohol.  If you are breastfeeding, limit how much and how often you drink alcohol.  Limit alcohol intake to no more than 1 drink per day for nonpregnant women. One drink equals 12 ounces of beer, 5 ounces of wine, or 1 ounces of hard liquor.  Do not use street drugs.  Do not share needles.  Ask your health care provider for help if you need support or information about quitting drugs.  Tell your health care provider if you often feel depressed.  Tell your health care provider if you have ever been abused or do not feel safe at home. This information is not intended to replace advice given to you by your health care provider. Make sure you discuss any questions you have with your health care provider. Document Released: 01/15/2011 Document Revised: 12/08/2015 Document Reviewed: 04/05/2015 Elsevier Interactive Patient Education  2018 [employer].  IF you received an x-ray today, you will receive an invoice from [facility]. Please contact [facility] at [phone] with questions or concerns regarding your invoice.   IF you received labwork today, you will receive an invoice from [facility]. Please contact [facility] at [phone] with questions or concerns regarding your invoice.   Our billing staff will not be able to assist you with questions regarding bills from these companies.  You will be contacted with the lab results as soon as they are available. The fastest way to get your results is to activate your My Chart account. Instructions are located on the last page of this paperwork. If you have not heard from [place] regarding the results in 2 weeks, please contact this office.

## 2017-06-18 NOTE — Progress Notes (Signed)
Grace Powell  MRN: [id number] DOB: 1980/12/20  Subjective:  Pt is a 36 y.o. G0P0 female who presents for annual physical exam. She is fasting today.   Last annual exam: 2016 Last dental exam: Never, brushes twice daily Last vision exam: 2016 Last pap smear: 2016 Menstrual cycles: Occur every 26 days, last about 5 days. Denies menorrhagia or dysmenorrhea.   Vaccinations      Tetanus: 04/15/15      Influenza: 04/2017  Acute issues: Pt would like referral to gynecologist to discuss infertility. She has been trying for over a year to get pregnant and has not had success. She is paying attention to her cycle in terms of when she is ovulating. She is having consistent unprotected sexual intercourse 2-3 times a week with her partner.     There are no active problems to display for this patient.   No current outpatient medications on file prior to visit.   No current facility-administered medications on file prior to visit.     No Known Allergies  Social History   Socioeconomic History  . Marital status: Single    Spouse name: N/A  . Number of children: 0  . Years of education: 110  . Highest education level: High school graduate  Social Needs  . Financial resource strain: Not hard at all  . Food insecurity - worry: Never true  . Food insecurity - inability: Never true  . Transportation needs - medical: No  . Transportation needs - non-medical: No  Occupational History  . Occupation: Radiographer, therapeutic    Comment: [employer]  Tobacco Use  . Smoking status: Never Smoker  . Smokeless tobacco: Never Used  Substance and Sexual Activity  . Alcohol use: No    Alcohol/week: 0.0 oz  . Drug use: No  . Sexual activity: Yes    Partners: Male  Other Topics Concern  . None  Social History Narrative   Patient is from [place]. She came to the [place] in 2013. Her family remains in [place]. She lives at home with her cousin.      Exercise: She sometimes walks  but does not do structured exercise regularly.      Diet: Eats mostly African food, which consists of lots of rice and stew. Beef, cow, and goat meat. Lots of vegetables. Gets a good amount of dairy from milk, cheese, and yogurt. Drinks only water.     Past Surgical History:  Procedure Laterality Date  . BREAST SURGERY Left    age 49 or 42    Family History  Problem Relation Age of Onset  . Cancer Father     Review of Systems  Constitutional: Negative for activity change, appetite change, chills, diaphoresis, fatigue, fever and unexpected weight change.  HENT: Negative for congestion, dental problem, drooling, ear discharge, ear pain, facial swelling, hearing loss, mouth sores, nosebleeds, postnasal drip, rhinorrhea, sinus pressure, sinus pain, sneezing, sore throat, tinnitus, trouble swallowing and voice change.   Eyes: Negative for photophobia, pain, discharge, redness, itching and visual disturbance.  Respiratory: Negative for apnea, cough, choking, chest tightness, shortness of breath, wheezing and stridor.   Cardiovascular: Negative for chest pain, palpitations and leg swelling.  Gastrointestinal: Negative for abdominal distention, abdominal pain, anal bleeding, blood in stool, constipation, diarrhea, nausea, rectal pain and vomiting.  Endocrine: Negative for cold intolerance, heat intolerance, polydipsia, polyphagia and polyuria.  Genitourinary: Positive for pelvic pain (will occasionally have bilateral mild pelvic pain, cannot recall if it  is near ovulation or not, not present today). Negative for decreased urine volume, difficulty urinating, dyspareunia, dysuria, enuresis, flank pain, frequency, genital sores, hematuria, menstrual problem, urgency, vaginal bleeding, vaginal discharge and vaginal pain.  Musculoskeletal: Negative for arthralgias, back pain, gait problem, joint swelling, myalgias, neck pain and neck stiffness.  Skin: Negative for color change, pallor, rash and wound.    Allergic/Immunologic: Negative for environmental allergies, food allergies and immunocompromised state.  Neurological: Negative for dizziness, tremors, seizures, syncope, facial asymmetry, speech difficulty, weakness, light-headedness, numbness and headaches.  Hematological: Negative for adenopathy. Does not bruise/bleed easily.  Psychiatric/Behavioral: Negative for agitation, behavioral problems, confusion, decreased concentration, dysphoric mood, hallucinations, self-injury, sleep disturbance and suicidal ideas. The patient is not nervous/anxious and is not hyperactive.     Objective:  BP 122/76 (BP Location: Left Arm, Patient Position: Sitting, Cuff Size: Normal)   Pulse 95   Temp 98 F (36.7 C) (Oral)   Resp 18   Ht 5' 3.94" (1.624 m)   Wt 114 lb 6.4 oz (51.9 kg)   LMP 06/11/2017 (Exact Date)   SpO2 99%   BMI 19.68 kg/m   Physical Exam  Constitutional: She is oriented to person, place, and time and well-developed, well-nourished, and in no distress.  HENT:  Head: Normocephalic and atraumatic.  Right Ear: Hearing, tympanic membrane, external ear and ear canal normal.  Left Ear: Hearing, tympanic membrane, external ear and ear canal normal.  Nose: Nose normal.  Mouth/Throat: Uvula is midline, oropharynx is clear and moist and mucous membranes are normal. No oropharyngeal exudate.  Eyes: Conjunctivae, EOM and lids are normal. Pupils are equal, round, and reactive to light. No scleral icterus.  Neck: Trachea normal and normal range of motion. No thyroid mass and no thyromegaly present.  Cardiovascular: Normal rate, regular rhythm, normal heart sounds and intact distal pulses.  Pulmonary/Chest: Effort normal and breath sounds normal.  Abdominal: Soft. Normal appearance and bowel sounds are normal. There is no tenderness.  Genitourinary: Uterus normal, cervix normal, right adnexa normal, left adnexa normal and vulva normal. Uterus is not enlarged and not tender. Cervix exhibits no  tenderness. Right adnexum displays no mass and no tenderness. Left adnexum displays no mass and no tenderness. Vaginal discharge (thin yellowish vaginal discharge with odor noted in vaginal canal ) found.  Lymphadenopathy:       Head (right side): No tonsillar, no preauricular, no posterior auricular and no occipital adenopathy present.       Head (left side): No tonsillar, no preauricular, no posterior auricular and no occipital adenopathy present.    She has no cervical adenopathy.       Right: No supraclavicular adenopathy present.       Left: No supraclavicular adenopathy present.  Neurological: She is alert and oriented to person, place, and time. She has normal sensation, normal strength and normal reflexes. Gait normal.  Skin: Skin is warm and dry.  Psychiatric: Affect normal.    Visual Acuity Screening   Right eye Left eye Both eyes  Without correction:     With correction: _0   Results for orders placed or performed in visit on 06/18/17 (from the past 24 hour(s))  POCT urinalysis dipstick     Status: None   Collection Time: 06/18/17 12:19 PM  Result Value Ref Range   Color, UA yellow yellow   Clarity, UA clear clear   Glucose, UA negative negative mg/dL   Bilirubin, UA negative negative   Ketones, POC UA negative negative mg/dL  Spec Grav, UA 1.025 1.010 - 1.025   Blood, UA negative negative   pH, UA 5.5 5.0 - 8.0   Protein Ur, POC negative negative mg/dL   Urobilinogen, UA 0.2 0.2 or 1.0 E.U./dL   Nitrite, UA Negative Negative   Leukocytes, UA Negative Negative    Assessment and Plan :  Discussed healthy lifestyle, diet, exercise, preventative care, vaccinations, and addressed patient's concerns. Plan for follow up in one year. Otherwise, plan for specific conditions below. 1. Annual physical exam Await lab results.  2. Screening for HIV (human immunodeficiency virus) - HIV antibody  3. Screen for STD (sexually transmitted disease) - RPR -  Hepatitis panel, acute - C. trachomatis/N. gonorrhoeae RNA  4. Screening, anemia, deficiency, iron - CBC with Differential/Platelet  5. Screening for metabolic disorder - [id number]  6. Screening, lipid - Lipid panel  7. Screening for thyroid disorder - TSH  8. Screening for hematuria or proteinuria - POCT urinalysis dipstick  9. Infertility, female - Ambulatory referral to Gynecology  10. Intermittent abdominal pain 11. Vaginal discharge No pain noted with pelvic exam. She does have some discharge noted on vaginal exam. Wet prep and STD labs pending. Pt encouraged to return to office if she continues to have pelvic discomfort.  Consider transvaginal and pelvic ultrasound at that time. - Wet prep, genital  Tenna Delaine, PA-C  [facility] 06/18/2017 1:09 PM

## 2017-06-19 LAB — HEPATITIS PANEL, ACUTE
HEP A IGM: NEGATIVE
HEP B C IGM: NEGATIVE
Hep C Virus Ab: <0.1 s/co ratio (ref 0.0–0.9)
Hepatitis B Surface Ag: NEGATIVE

## 2017-06-19 LAB — RPR: RPR Ser Ql: NONREACTIVE

## 2017-06-19 LAB — CBC WITH DIFFERENTIAL/PLATELET
BASOS ABS: 0 10*3/uL (ref 0.0–0.2)
Basos: 1 %
EOS (ABSOLUTE): 0.1 10*3/uL (ref 0.0–0.4)
Eos: 1 %
Hematocrit: 41.6 % (ref 34.0–46.6)
Hemoglobin: 13.8 g/dL (ref 11.1–15.9)
Immature Grans (Abs): 0 10*3/uL (ref 0.0–0.1)
Immature Granulocytes: 0 %
LYMPHS ABS: 1.6 10*3/uL (ref 0.7–3.1)
Lymphs: 41 %
MCH: 28.9 pg (ref 26.6–33.0)
MCHC: 33.2 g/dL (ref 31.5–35.7)
MCV: 87 fL (ref 79–97)
MONOS ABS: 0.3 10*3/uL (ref 0.1–0.9)
Monocytes: 7 %
Neutrophils Absolute: 2 10*3/uL (ref 1.4–7.0)
Neutrophils: 50 %
PLATELETS: 331 10*3/uL (ref 150–379)
RBC: 4.77 x10E6/uL (ref 3.77–5.28)
RDW: 13.5 % (ref 12.3–15.4)
WBC: 4 10*3/uL (ref 3.4–10.8)

## 2017-06-19 LAB — CMP14+EGFR
ALK PHOS: 100 IU/L (ref 39–117)
ALT: 23 IU/L (ref 0–32)
AST: 26 IU/L (ref 0–40)
Albumin/Globulin Ratio: 1.5 (ref 1.2–2.2)
Albumin: 4.8 g/dL (ref 3.5–5.5)
BUN/Creatinine Ratio: 12 (ref 9–23)
BUN: 8 mg/dL (ref 6–20)
Bilirubin Total: 0.6 mg/dL (ref 0.0–1.2)
CO2: 20 mmol/L (ref 20–29)
CREATININE: 0.67 mg/dL (ref 0.57–1.00)
Calcium: 9.6 mg/dL (ref 8.7–10.2)
Chloride: 104 mmol/L (ref 96–106)
GFR calc Af Amer: 130 mL/min/{1.73_m2} (ref >59)
GFR calc non Af Amer: 113 mL/min/{1.73_m2} (ref >59)
GLOBULIN, TOTAL: 3.2 g/dL (ref 1.5–4.5)
GLUCOSE: 75 mg/dL (ref 65–99)
Potassium: 4.2 mmol/L (ref 3.5–5.2)
SODIUM: 140 mmol/L (ref 134–144)
Total Protein: 8 g/dL (ref 6.0–8.5)

## 2017-06-19 LAB — LIPID PANEL
CHOLESTEROL TOTAL: 233 mg/dL — AB (ref 100–199)
Chol/HDL Ratio: 4.2 ratio (ref 0.0–4.4)
HDL: 56 mg/dL (ref >39)
LDL CALC: 167 mg/dL — AB (ref 0–99)
TRIGLYCERIDES: 50 mg/dL (ref 0–149)
VLDL Cholesterol Cal: 10 mg/dL (ref 5–40)

## 2017-06-19 LAB — HIV ANTIBODY (ROUTINE TESTING W REFLEX): HIV SCREEN 4TH GENERATION: NONREACTIVE

## 2017-06-19 LAB — TSH: TSH: 1.41 u[IU]/mL (ref 0.450–4.500)

## 2017-06-26 LAB — TRICHOMONAS CULTURE

## 2017-06-26 LAB — WET PREP, GENITAL
Clue Cell Exam: NEGATIVE
Trichomonas Exam: NEGATIVE
YEAST EXAM: POSITIVE — AB

## 2017-06-27 ENCOUNTER — Other Ambulatory Visit: Payer: Self-pay | Admitting: Physician Assistant

## 2017-06-27 MED ORDER — FLUCONAZOLE 150 MG PO TABS: 150.0000 mg | ORAL_TABLET | Freq: Once | ORAL | 0 refills | Status: AC

## 2017-06-27 NOTE — Progress Notes (Signed)
Meds ordered this encounter  Medications  . fluconazole (DIFLUCAN) 150 MG tablet    Sig: Take 1 tablet (150 mg total) by mouth once for 1 dose.    Dispense:  1 tablet    Refill:  0    Order Specific Question:   Supervising Provider    Answer:   SMITH, KRISTI M [[id number]     

## 2018-03-20 NOTE — Progress Notes (Signed)
Grace Powell  MRN: [id number] DOB: 07-Dec-1980  Subjective:  Pt is a 37 y.o. G0P0  female who presents for annual physical exam. Pt is fasting today.   Diet: breakfast: cereal, fried egg; lunch: rice, salad; dinner: "food from my country" ex: fufu rice and stew. Beef, cow, and goat meat. Lots of vegetables. Gets a good amount of dairy from milk, cheese, and yogurt. Drinks only wDrinks mostly water.  Exercise: "Maybe once in a while."  Sleep: 7-8 hrs a night Menses: Regular, occur monthly. Currently on menstrual cycle. Saw gynecology last month. She is wanting to have children. Sexually active with monogamous partner.  BM: daily  Last dental exam: never, brushes BID Last vision exam: 02/2018, wears Rx eyeglasses Last pap smear: 04/15/15 Vaccinations        Tetanus: 04/15/15        Influenza: 04/2017    There are no active problems to display for this patient.   No current outpatient medications on file prior to visit.   No current facility-administered medications on file prior to visit.     No Known Allergies  Social History   Socioeconomic History  . Marital status: Single    Spouse name: N/A  . Number of children: 0  . Years of education: 70  . Highest education level: High school graduate  Occupational History  . Occupation: Database administrator    Comment: [employer]  Social Needs  . Financial resource strain: Not hard at all  . Food insecurity:    Worry: Never true    Inability: Never true  . Transportation needs:    Medical: No    Non-medical: No  Tobacco Use  . Smoking status: Never Smoker  . Smokeless tobacco: Never Used  Substance and Sexual Activity  . Alcohol use: No    Alcohol/week: 0.0 standard drinks  . Drug use: No  . Sexual activity: Yes    Partners: Male  Lifestyle  . Physical activity:    Days per week: 0 days    Minutes per session: 0 min  . Stress: Only a little  Relationships  . Social connections:    Talks on phone:  More than three times a week    Gets together: Once a week    Attends religious service: More than 4 times per year    Active member of club or organization: Yes    Attends meetings of clubs or organizations: More than 4 times per year    Relationship status: Separated  Other Topics Concern  . Not on file  Social History Narrative   Patient is from [place]. She came to the [place] in 2013. Her family remains in [place]. She lives at home with her cousin.      Exercise: She sometimes walks but does not do structured exercise regularly.      Diet: Eats mostly African food, which consists of lots of rice and stew. Beef, cow, and goat meat. Lots of vegetables. Gets a good amount of dairy from milk, cheese, and yogurt. Drinks only water.     Past Surgical History:  Procedure Laterality Date  . BREAST SURGERY Left    age 83 or 57    Family History  Problem Relation Age of Onset  . Cancer Father     Review of Systems  Constitutional: Negative for activity change, appetite change, chills, diaphoresis, fatigue, fever and unexpected weight change.  HENT: Negative for congestion, dental problem, drooling, ear discharge, ear  pain, facial swelling, hearing loss, mouth sores, nosebleeds, postnasal drip, rhinorrhea, sinus pressure, sinus pain, sneezing, sore throat, tinnitus, trouble swallowing and voice change.   Eyes: Negative for photophobia, pain, discharge, redness, itching and visual disturbance.  Respiratory: Negative for apnea, cough, choking, chest tightness, shortness of breath, wheezing and stridor.   Cardiovascular: Negative for chest pain, palpitations and leg swelling.  Gastrointestinal: Negative for abdominal distention, abdominal pain, anal bleeding, blood in stool, constipation, diarrhea, nausea, rectal pain and vomiting.  Endocrine: Negative for cold intolerance, heat intolerance, polydipsia, polyphagia and polyuria.  Genitourinary: Negative for decreased urine volume,  difficulty urinating, dyspareunia, dysuria, enuresis, flank pain, frequency, genital sores, hematuria, menstrual problem, pelvic pain, urgency, vaginal bleeding, vaginal discharge and vaginal pain.  Musculoskeletal: Negative for arthralgias, back pain, gait problem, joint swelling, myalgias, neck pain and neck stiffness.  Skin: Negative for color change, pallor, rash and wound.  Allergic/Immunologic: Negative for environmental allergies, food allergies and immunocompromised state.  Neurological: Negative for dizziness, tremors, seizures, syncope, facial asymmetry, speech difficulty, weakness, light-headedness, numbness and headaches.  Hematological: Negative for adenopathy. Does not bruise/bleed easily.  Psychiatric/Behavioral: Negative for agitation, behavioral problems, confusion, decreased concentration, dysphoric mood, hallucinations, self-injury, sleep disturbance and suicidal ideas. The patient is not nervous/anxious and is not hyperactive.     Objective:  BP 110/70   Pulse 91   Temp 98 F (36.7 C) (Oral)   Resp 18   Ht 5' 3.19" (1.605 m)   Wt 114 lb 3.2 oz (51.8 kg)   LMP  (LMP Unknown)   SpO2 99%   BMI 20.11 kg/m   Physical Exam  Constitutional: She is oriented to person, place, and time. She appears well-developed and well-nourished. No distress.  HENT:  Head: Normocephalic and atraumatic.  Right Ear: Hearing, tympanic membrane, external ear and ear canal normal.  Left Ear: Hearing, tympanic membrane, external ear and ear canal normal.  Nose: Nose normal.  Mouth/Throat: Uvula is midline, oropharynx is clear and moist and mucous membranes are normal. No oropharyngeal exudate.  Eyes: Pupils are equal, round, and reactive to light. Conjunctivae, EOM and lids are normal. No scleral icterus.  Neck: Trachea normal and normal range of motion. No thyroid mass and no thyromegaly present.  Cardiovascular: Normal rate, regular rhythm, normal heart sounds and intact distal pulses.    Pulmonary/Chest: Effort normal and breath sounds normal. Right breast exhibits mass (Palpable, tender, pea-sized freely mobile mass at superior areola) and tenderness. Right breast exhibits no inverted nipple, no nipple discharge and no skin change. Left breast exhibits mass ( Palpable, nontender, pea-sized freely mobile mass in UOQ). Left breast exhibits no inverted nipple, no nipple discharge and no tenderness. No breast discharge or bleeding. Breasts are symmetrical.  CMA chaperone present for breast exam.  2 well-healed surgical scars noted on left breast.  Abdominal: Soft. Normal appearance and bowel sounds are normal. There is no tenderness.  Lymphadenopathy:       Head (right side): No tonsillar, no preauricular, no posterior auricular and no occipital adenopathy present.       Head (left side): No tonsillar, no preauricular, no posterior auricular and no occipital adenopathy present.    She has no cervical adenopathy.       Right: No supraclavicular adenopathy present.       Left: No supraclavicular adenopathy present.  Neurological: She is alert and oriented to person, place, and time. She has normal strength and normal reflexes.  Skin: Skin is warm and dry.    Visual  Acuity Screening   Right eye Left eye Both eyes  Without correction:     With correction: 20/20 20/20 20/20     Assessment and Plan :  Discussed healthy lifestyle, diet, exercise, preventative care, vaccinations, and addressed patient's concerns. Plan for follow up in one year. Otherwise, plan for specific conditions below.  1. Annual physical exam Await lab results.   2. Screening, anemia, deficiency, iron - CBC with Differential/Platelet  3. Screening for metabolic disorder - Comprehensive metabolic panel  4. Screening, lipid - Lipid panel  5. Need for influenza vaccination - Flu Vaccine QUAD 36+ mos IM  6. Breast mass Found incidentally on breast exam in office.  Patient is on menstrual cycle, which  could account for tenderness in the right breast.  She has had prior breast surgery at age 56 on her left breast.  This was in [place] and she is unaware as to what the surgery was.  Plan for bilateral breast ultrasound. - US BREAST COMPLETE UNI LEFT INC AXILLA; Future - US BREAST COMPLETE UNI RIGHT INC AXILLA; Future   Benjiman Core, PA-C  Primary [facility] at [facility] 03/21/2018 8:33 AM

## 2018-03-21 ENCOUNTER — Other Ambulatory Visit: Payer: Self-pay | Admitting: Physician Assistant

## 2018-03-21 ENCOUNTER — Other Ambulatory Visit: Payer: Self-pay

## 2018-03-21 ENCOUNTER — Encounter: Payer: Self-pay | Admitting: Physician Assistant

## 2018-03-21 ENCOUNTER — Ambulatory Visit (INDEPENDENT_AMBULATORY_CARE_PROVIDER_SITE_OTHER): Payer: BLUE CROSS/BLUE SHIELD | Admitting: Physician Assistant

## 2018-03-21 VITALS — BP 110/70 | HR 91 | Temp 98.0°F | Resp 18 | Ht 63.19 in | Wt 114.2 lb

## 2018-03-21 DIAGNOSIS — Z0000 Encounter for general adult medical examination without abnormal findings: Secondary | ICD-10-CM

## 2018-03-21 DIAGNOSIS — Z23 Encounter for immunization: Secondary | ICD-10-CM | POA: Diagnosis not present

## 2018-03-21 DIAGNOSIS — N63 Unspecified lump in unspecified breast: Secondary | ICD-10-CM

## 2018-03-21 DIAGNOSIS — Z0001 Encounter for general adult medical examination with abnormal findings: Secondary | ICD-10-CM | POA: Diagnosis not present

## 2018-03-21 DIAGNOSIS — Z13220 Encounter for screening for lipoid disorders: Secondary | ICD-10-CM | POA: Diagnosis not present

## 2018-03-21 DIAGNOSIS — Z130 Encounter for screening for diseases of the blood and blood-forming organs and certain disorders involving the immune mechanism: Secondary | ICD-10-CM

## 2018-03-21 DIAGNOSIS — Z13228 Encounter for screening for other metabolic disorders: Secondary | ICD-10-CM | POA: Diagnosis not present

## 2018-03-21 LAB — LIPID PANEL
CHOL/HDL RATIO: 4.7 ratio — AB (ref 0.0–4.4)
Cholesterol, Total: 240 mg/dL — ABNORMAL HIGH (ref 100–199)
HDL: 51 mg/dL (ref >39)
LDL Calculated: 181 mg/dL — ABNORMAL HIGH (ref 0–99)
TRIGLYCERIDES: 39 mg/dL (ref 0–149)
VLDL Cholesterol Cal: 8 mg/dL (ref 5–40)

## 2018-03-21 LAB — COMPREHENSIVE METABOLIC PANEL
A/G RATIO: 1.8 (ref 1.2–2.2)
ALT: 27 IU/L (ref 0–32)
AST: 26 IU/L (ref 0–40)
Albumin: 4.6 g/dL (ref 3.5–5.5)
Alkaline Phosphatase: 100 IU/L (ref 39–117)
BUN/Creatinine Ratio: 10 (ref 9–23)
BUN: 7 mg/dL (ref 6–20)
Bilirubin Total: 0.3 mg/dL (ref 0.0–1.2)
CALCIUM: 9.5 mg/dL (ref 8.7–10.2)
CO2: 19 mmol/L — AB (ref 20–29)
Chloride: 104 mmol/L (ref 96–106)
Creatinine, Ser: 0.7 mg/dL (ref 0.57–1.00)
GFR calc Af Amer: 127 mL/min/{1.73_m2} (ref >59)
GFR, EST NON AFRICAN AMERICAN: 110 mL/min/{1.73_m2} (ref >59)
Globulin, Total: 2.6 g/dL (ref 1.5–4.5)
Glucose: 87 mg/dL (ref 65–99)
POTASSIUM: 4.7 mmol/L (ref 3.5–5.2)
SODIUM: 139 mmol/L (ref 134–144)
Total Protein: 7.2 g/dL (ref 6.0–8.5)

## 2018-03-21 LAB — CBC WITH DIFFERENTIAL/PLATELET
BASOS ABS: 0 10*3/uL (ref 0.0–0.2)
BASOS: 1 %
EOS (ABSOLUTE): 0.1 10*3/uL (ref 0.0–0.4)
Eos: 3 %
HEMOGLOBIN: 14.1 g/dL (ref 11.1–15.9)
Hematocrit: 43.1 % (ref 34.0–46.6)
IMMATURE GRANS (ABS): 0 10*3/uL (ref 0.0–0.1)
Immature Granulocytes: 0 %
LYMPHS ABS: 1.3 10*3/uL (ref 0.7–3.1)
LYMPHS: 35 %
MCH: 29.6 pg (ref 26.6–33.0)
MCHC: 32.7 g/dL (ref 31.5–35.7)
MCV: 90 fL (ref 79–97)
MONOCYTES: 8 %
Monocytes Absolute: 0.3 10*3/uL (ref 0.1–0.9)
NEUTROS ABS: 1.9 10*3/uL (ref 1.4–7.0)
Neutrophils: 53 %
Platelets: 242 10*3/uL (ref 150–450)
RBC: 4.77 x10E6/uL (ref 3.77–5.28)
RDW: 13.1 % (ref 12.3–15.4)
WBC: 3.6 10*3/uL (ref 3.4–10.8)

## 2018-03-21 NOTE — Patient Instructions (Addendum)
It was a pleasure seeing you today. I have ordered bilateral breast ultrasound for further evaluation of breast masses. They should contact you within 2 weeks to schedule this.    If you have lab work done today you will be contacted with your lab results within the next 2 weeks.  If you have not heard from [place] then please contact us. The fastest way to get your results is to register for My Chart.   Health Maintenance, Female Adopting a healthy lifestyle and getting preventive care can go a long way to promote health and wellness. Talk with your health care provider about what schedule of regular examinations is right for you. This is a good chance for you to check in with your provider about disease prevention and staying healthy. In between checkups, there are plenty of things you can do on your own. Experts have done a lot of research about which lifestyle changes and preventive measures are most likely to keep you healthy. Ask your health care provider for more information. Weight and diet Eat a healthy diet  Be sure to include plenty of vegetables, fruits, low-fat dairy products, and lean protein.  Do not eat a lot of foods high in solid fats, added sugars, or salt.  Get regular exercise. This is one of the most important things you can do for your health. ? Most adults should exercise for at least 150 minutes each week. The exercise should increase your heart rate and make you sweat (moderate-intensity exercise). ? Most adults should also do strengthening exercises at least twice a week. This is in addition to the moderate-intensity exercise.  Maintain a healthy weight  Body mass index (BMI) is a measurement that can be used to identify possible weight problems. It estimates body fat based on height and weight. Your health care provider can help determine your BMI and help you achieve or maintain a healthy weight.  For females 62 years of age and older: ? A BMI below 18.5 is considered  underweight. ? A BMI of 18.5 to 24.9 is normal. ? A BMI of 25 to 29.9 is considered overweight. ? A BMI of 30 and above is considered obese.  Watch levels of cholesterol and blood lipids  You should start having your blood tested for lipids and cholesterol at 38 years of age, then have this test every 5 years.  You may need to have your cholesterol levels checked more often if: ? Your lipid or cholesterol levels are high. ? You are older than 38 years of age. ? You are at high risk for heart disease.  Cancer screening Lung Cancer  Lung cancer screening is recommended for adults 58-53 years old who are at high risk for lung cancer because of a history of smoking.  A yearly low-dose CT scan of the lungs is recommended for people who: ? Currently smoke. ? Have quit within the past 15 years. ? Have at least a 30-pack-year history of smoking. A pack year is smoking an average of one pack of cigarettes a day for 1 year.  Yearly screening should continue until it has been 15 years since you quit.  Yearly screening should stop if you develop a health problem that would prevent you from having lung cancer treatment.  Breast Cancer  Practice breast self-awareness. This means understanding how your breasts normally appear and feel.  It also means doing regular breast self-exams. Let your health care provider know about any changes, no matter how small.  If you are in your 20s or 30s, you should have a clinical breast exam (CBE) by a health care provider every 1-3 years as part of a regular health exam.  If you are 40 or older, have a CBE every year. Also consider having a breast X-ray (mammogram) every year.  If you have a family history of breast cancer, talk to your health care provider about genetic screening.  If you are at high risk for breast cancer, talk to your health care provider about having an MRI and a mammogram every year.  Breast cancer gene (BRCA) assessment is  recommended for women who have family members with BRCA-related cancers. BRCA-related cancers include: ? Breast. ? Ovarian. ? Tubal. ? Peritoneal cancers.  Results of the assessment will determine the need for genetic counseling and BRCA1 and BRCA2 testing.  Cervical Cancer Your health care provider may recommend that you be screened regularly for cancer of the pelvic organs (ovaries, uterus, and vagina). This screening involves a pelvic examination, including checking for microscopic changes to the surface of your cervix (Pap test). You may be encouraged to have this screening done every 3 years, beginning at age 21.  For women ages 30-65, health care providers may recommend pelvic exams and Pap testing every 3 years, or they may recommend the Pap and pelvic exam, combined with testing for human papilloma virus (HPV), every 5 years. Some types of HPV increase your risk of cervical cancer. Testing for HPV may also be done on women of any age with unclear Pap test results.  Other health care providers may not recommend any screening for nonpregnant women who are considered low risk for pelvic cancer and who do not have symptoms. Ask your health care provider if a screening pelvic exam is right for you.  If you have had past treatment for cervical cancer or a condition that could lead to cancer, you need Pap tests and screening for cancer for at least 20 years after your treatment. If Pap tests have been discontinued, your risk factors (such as having a new sexual partner) need to be reassessed to determine if screening should resume. Some women have medical problems that increase the chance of getting cervical cancer. In these cases, your health care provider may recommend more frequent screening and Pap tests.  Colorectal Cancer  This type of cancer can be detected and often prevented.  Routine colorectal cancer screening usually begins at 38 years of age and continues through 38 years of  age.  Your health care provider may recommend screening at an earlier age if you have risk factors for colon cancer.  Your health care provider may also recommend using home test kits to check for hidden blood in the stool.  A small camera at the end of a tube can be used to examine your colon directly (sigmoidoscopy or colonoscopy). This is done to check for the earliest forms of colorectal cancer.  Routine screening usually begins at age 50.  Direct examination of the colon should be repeated every 5-10 years through 38 years of age. However, you may need to be screened more often if early forms of precancerous polyps or small growths are found.  Skin Cancer  Check your skin from head to toe regularly.  Tell your health care provider about any new moles or changes in moles, especially if there is a change in a mole's shape or color.  Also tell your health care provider if you have a mole that is   larger than the size of a pencil eraser.  Always use sunscreen. Apply sunscreen liberally and repeatedly throughout the day.  Protect yourself by wearing long sleeves, pants, a wide-brimmed hat, and sunglasses whenever you are outside.  Heart disease, diabetes, and high blood pressure  High blood pressure causes heart disease and increases the risk of stroke. High blood pressure is more likely to develop in: ? People who have blood pressure in the high end of the normal range (130-139/85-89 mm Hg). ? People who are overweight or obese. ? People who are African American.  If you are 58-53 years of age, have your blood pressure checked every 3-5 years. If you are 41 years of age or older, have your blood pressure checked every year. You should have your blood pressure measured twice-once when you are at a hospital or clinic, and once when you are not at a hospital or clinic. Record the average of the two measurements. To check your blood pressure when you are not at a hospital or clinic, you  can use: ? An automated blood pressure machine at a pharmacy. ? A home blood pressure monitor.  If you are between 25 years and 7 years old, ask your health care provider if you should take aspirin to prevent strokes.  Have regular diabetes screenings. This involves taking a blood sample to check your fasting blood sugar level. ? If you are at a normal weight and have a low risk for diabetes, have this test once every three years after 38 years of age. ? If you are overweight and have a high risk for diabetes, consider being tested at a younger age or more often. Preventing infection Hepatitis B  If you have a higher risk for hepatitis B, you should be screened for this virus. You are considered at high risk for hepatitis B if: ? You were born in a country where hepatitis B is common. Ask your health care provider which countries are considered high risk. ? Your parents were born in a high-risk country, and you have not been immunized against hepatitis B (hepatitis B vaccine). ? You have HIV or AIDS. ? You use needles to inject street drugs. ? You live with someone who has hepatitis B. ? You have had sex with someone who has hepatitis B. ? You get hemodialysis treatment. ? You take certain medicines for conditions, including cancer, organ transplantation, and autoimmune conditions.  Hepatitis C  Blood testing is recommended for: ? Everyone born from [birth year] through 1966. ? Anyone with known risk factors for hepatitis C.  Sexually transmitted infections (STIs)  You should be screened for sexually transmitted infections (STIs) including gonorrhea and chlamydia if: ? You are sexually active and are younger than 38 years of age. ? You are older than 38 years of age and your health care provider tells you that you are at risk for this type of infection. ? Your sexual activity has changed since you were last screened and you are at an increased risk for chlamydia or gonorrhea. Ask your  health care provider if you are at risk.  If you do not have HIV, but are at risk, it may be recommended that you take a prescription medicine daily to prevent HIV infection. This is called pre-exposure prophylaxis (PrEP). You are considered at risk if: ? You are sexually active and do not regularly use condoms or know the HIV status of your partner(s). ? You take drugs by injection. ? You are sexually active  with a partner who has HIV.  Talk with your health care provider about whether you are at high risk of being infected with HIV. If you choose to begin PrEP, you should first be tested for HIV. You should then be tested every 3 months for as long as you are taking PrEP. Pregnancy  If you are premenopausal and you may become pregnant, ask your health care provider about preconception counseling.  If you may become pregnant, take 400 to 800 micrograms (mcg) of folic acid every day.  If you want to prevent pregnancy, talk to your health care provider about birth control (contraception). Osteoporosis and menopause  Osteoporosis is a disease in which the bones lose minerals and strength with aging. This can result in serious bone fractures. Your risk for osteoporosis can be identified using a bone density scan.  If you are 31 years of age or older, or if you are at risk for osteoporosis and fractures, ask your health care provider if you should be screened.  Ask your health care provider whether you should take a calcium or vitamin D supplement to lower your risk for osteoporosis.  Menopause may have certain physical symptoms and risks.  Hormone replacement therapy may reduce some of these symptoms and risks. Talk to your health care provider about whether hormone replacement therapy is right for you. Follow these instructions at home:  Schedule regular health, dental, and eye exams.  Stay current with your immunizations.  Do not use any tobacco products including cigarettes, chewing  tobacco, or electronic cigarettes.  If you are pregnant, do not drink alcohol.  If you are breastfeeding, limit how much and how often you drink alcohol.  Limit alcohol intake to no more than 1 drink per day for nonpregnant women. One drink equals 12 ounces of beer, 5 ounces of wine, or 1 ounces of hard liquor.  Do not use street drugs.  Do not share needles.  Ask your health care provider for help if you need support or information about quitting drugs.  Tell your health care provider if you often feel depressed.  Tell your health care provider if you have ever been abused or do not feel safe at home. This information is not intended to replace advice given to you by your health care provider. Make sure you discuss any questions you have with your health care provider. Document Released: 01/15/2011 Document Revised: 12/08/2015 Document Reviewed: 04/05/2015 Elsevier Interactive Patient Education  2018 [employer].   IF you received an x-ray today, you will receive an invoice from [facility]. Please contact [facility] at [phone] with questions or concerns regarding your invoice.   IF you received labwork today, you will receive an invoice from [facility]. Please contact [facility] at [phone] with questions or concerns regarding your invoice.   Our billing staff will not be able to assist you with questions regarding bills from these companies.  You will be contacted with the lab results as soon as they are available. The fastest way to get your results is to activate your My Chart account. Instructions are located on the last page of this paperwork. If you have not heard from [place] regarding the results in 2 weeks, please contact this office.

## 2018-03-25 ENCOUNTER — Other Ambulatory Visit: Payer: Self-pay

## 2018-03-25 ENCOUNTER — Ambulatory Visit
Admission: RE | Admit: 2018-03-25 | Discharge: 2018-03-25 | Disposition: A | Discharge status: Home / Self Care | Payer: BLUE CROSS/BLUE SHIELD | Source: Ambulatory Visit | Attending: Physician Assistant | Admitting: Physician Assistant

## 2018-03-25 DIAGNOSIS — N63 Unspecified lump in unspecified breast: Secondary | ICD-10-CM

## 2018-12-29 IMAGING — MG DIGITAL DIAGNOSTIC BILATERAL MAMMOGRAM WITH TOMO AND CAD
8 of 14 series · 8 of 40 positions shown · non-contrast
Comparison: None.

CLINICAL DATA: Per recent clinic note, ordering physician describes
a palpable lump within the upper-outer quadrant of the LEFT breast.
Ordering physician also describes a palpable lump within the
periareolar RIGHT breast.

EXAM:
DIGITAL DIAGNOSTIC BILATERAL MAMMOGRAM WITH CAD AND TOMO
ULTRASOUND BILATERAL BREAST

[R MLO synth-2D (1 of 2)]
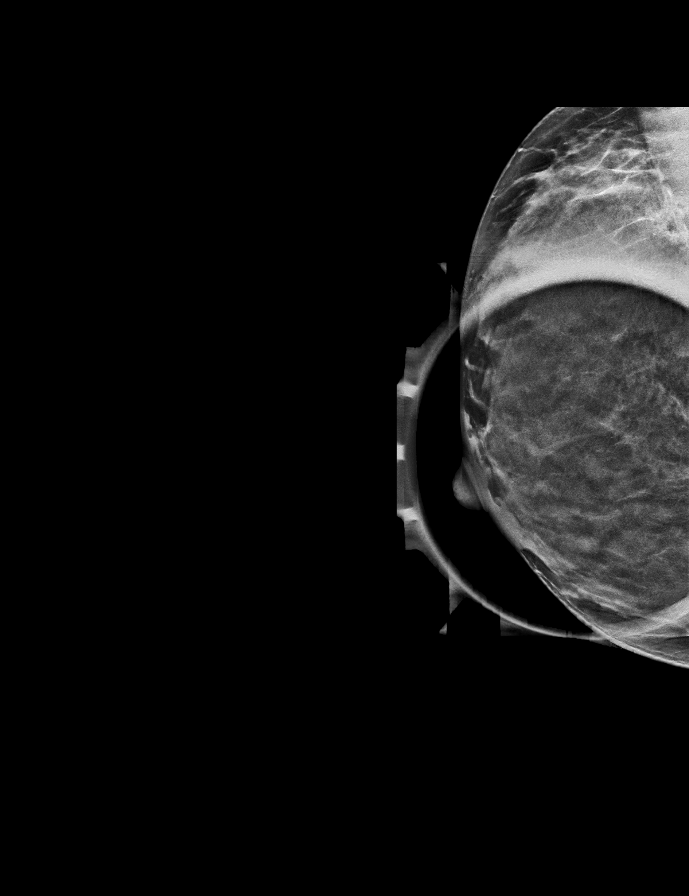

[R MLO synth-2D (2 of 2)]
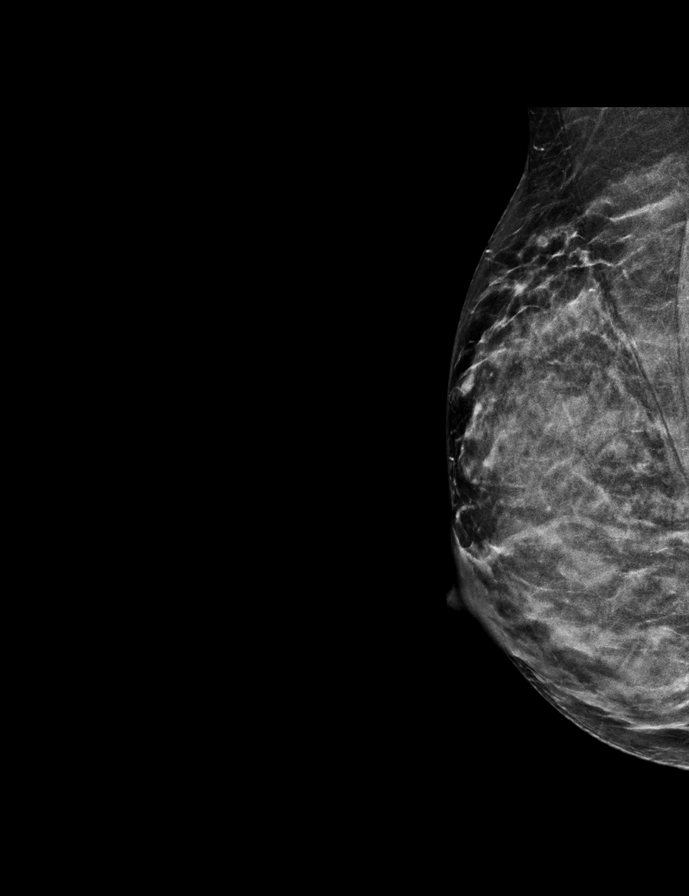

[R CC synth-2D]
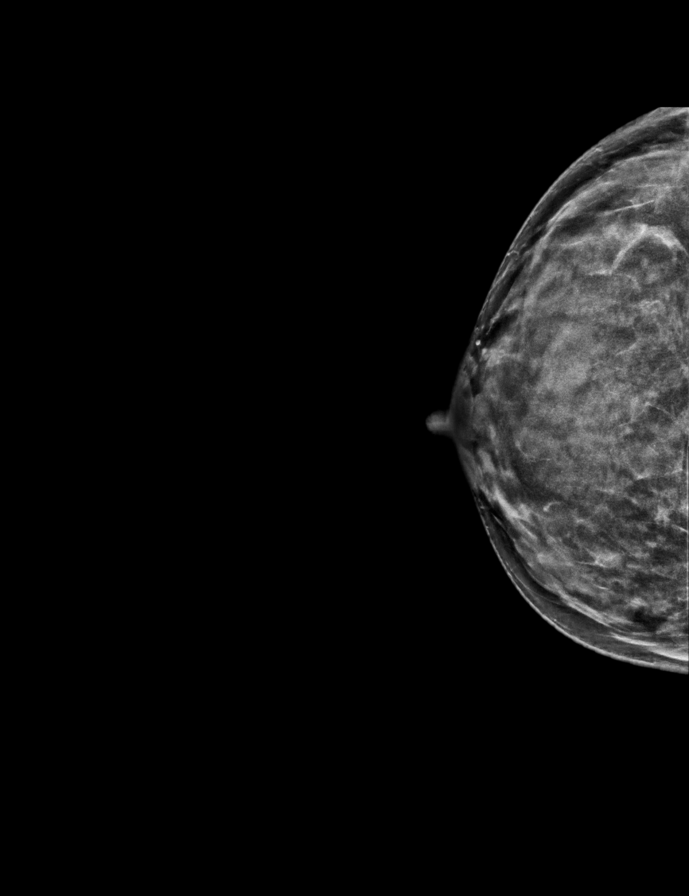

[L CC synth-2D (1 of 2)]
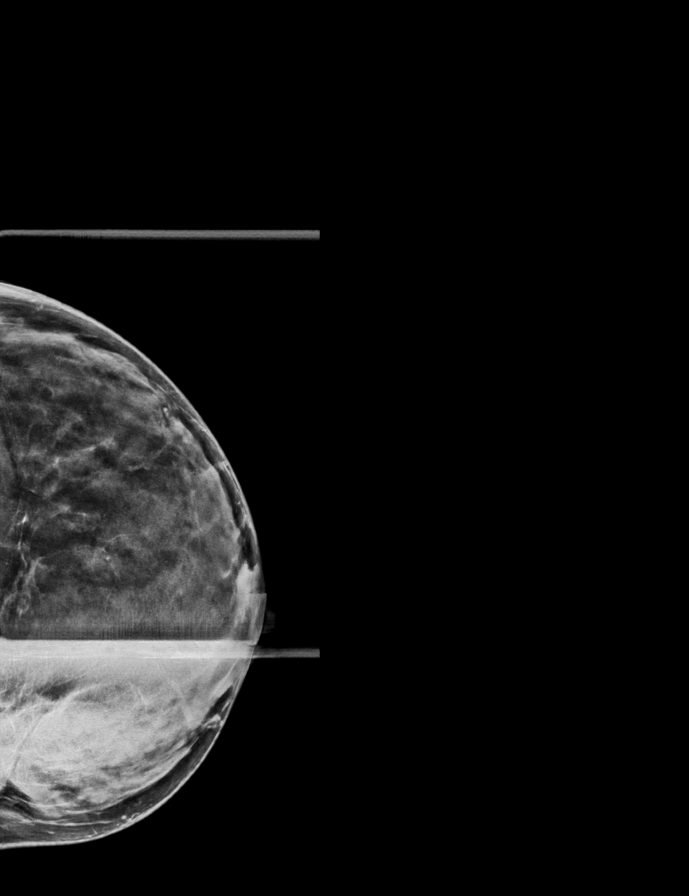

[L CC synth-2D (2 of 2)]
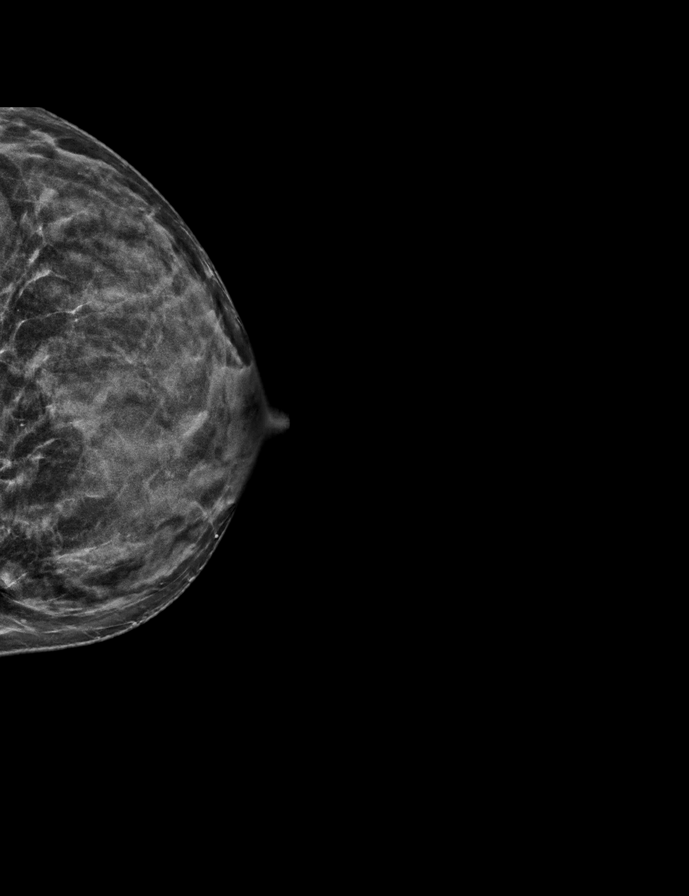

[L MLO synth-2D (1 of 2)]
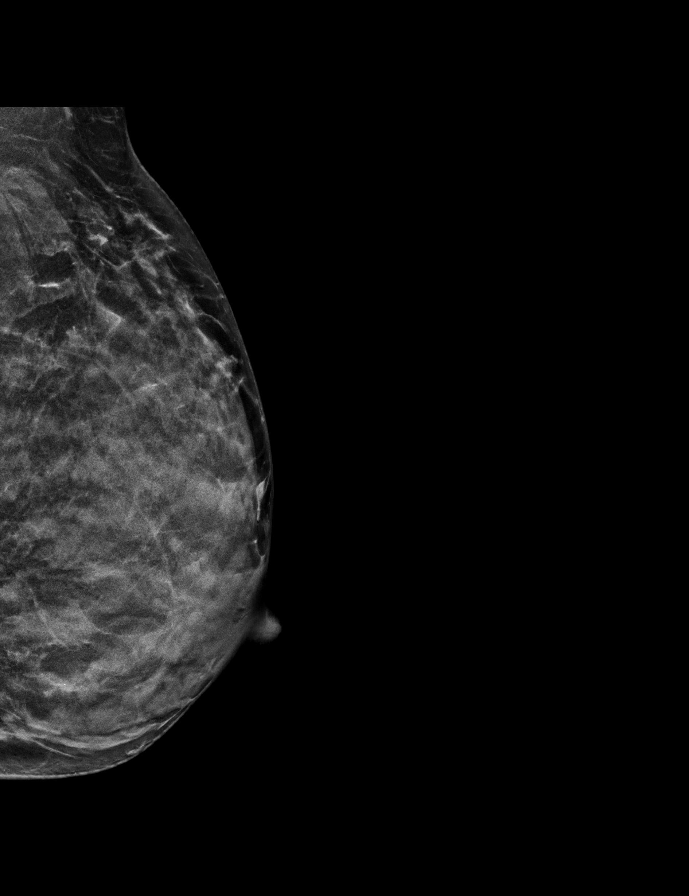

[L MLO synth-2D (2 of 2)]
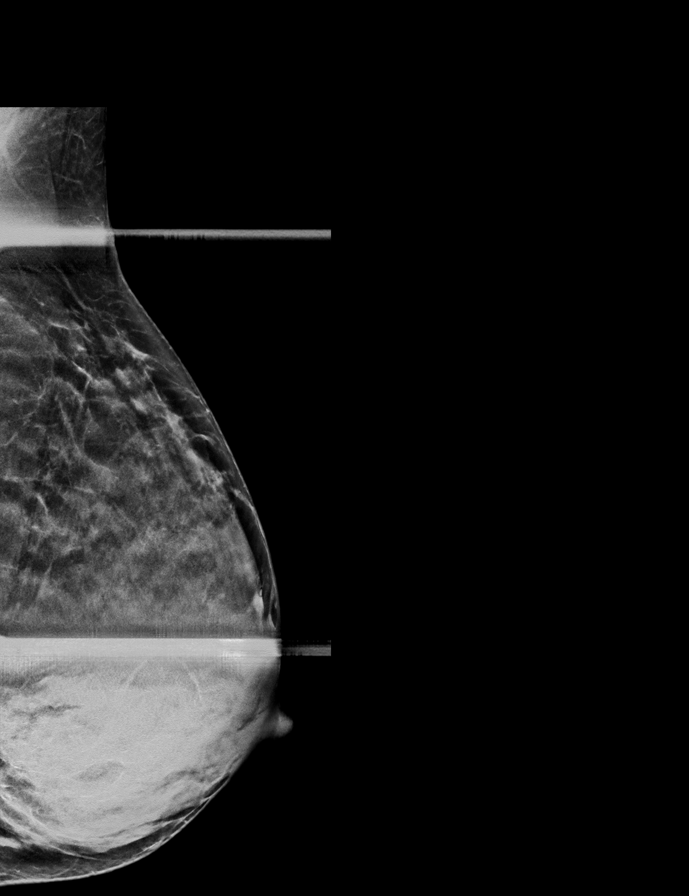

[R CC tomo · tomo slice 27/53.0]
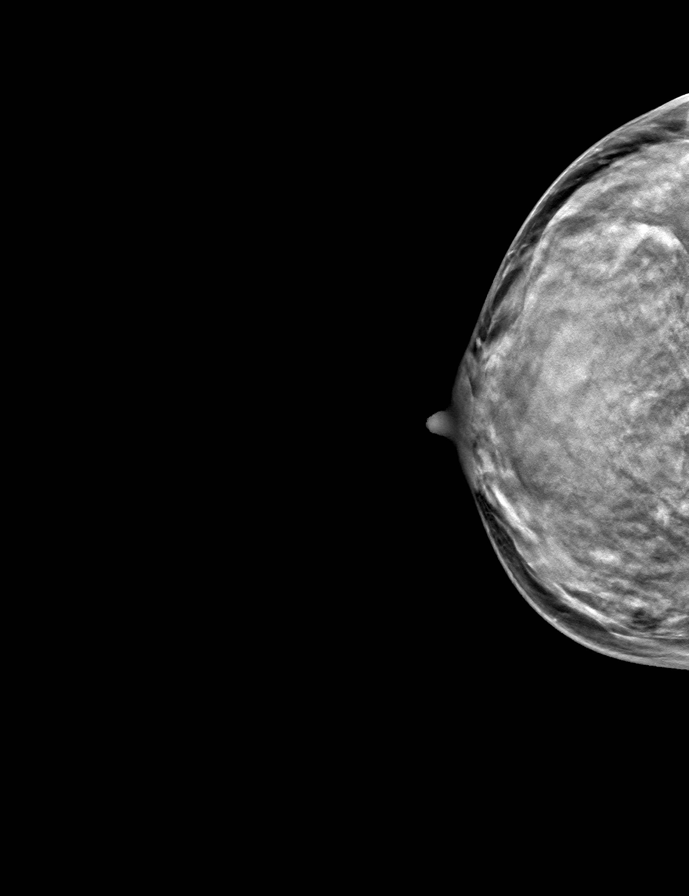

[8 of 40 positions shown; findings below may reference images not displayed]

ACR Breast Density Category d: The breast tissue is extremely dense,
which lowers the sensitivity of mammography.
FINDINGS: Bilateral CC and MLO views were obtained, with additional 3D
tomosynthesis, and with additional spot compression views of the
upper-outer LEFT breast and periareolar RIGHT breast corresponding
to the areas of clinical concern.

There are no dominant masses, suspicious calcifications or secondary
signs of malignancy within either breast. Specifically, there is no
mammographic abnormality within the upper-outer LEFT breast or
periareolar RIGHT breast corresponding to the areas of clinical
concern.

Mammographic images were processed with CAD.

On physical exam, there is vague soft tissue thickening within the
upper-outer LEFT breast and periareolar RIGHT breast without
definite circumscribed mass identified in either area.

LEFT breast: Targeted ultrasound is performed, evaluating the entire
upper outer quadrant of the LEFT breast, showing ridges of normal
thick fibroglandular tissue with intermixed normal fatty lobules.
There is a small benign cyst within the upper-outer quadrant,
measuring approximately 4 mm, possible correlate for the palpable
lump identified by the ordering physician, more likely an incidental
finding. No suspicious solid or cystic mass is identified.

RIGHT breast: Targeted ultrasound of the RIGHT breast is performed,
evaluating the subareolar and periareolar RIGHT breast, showing
normal fibroglandular tissues and fat lobules throughout. Additional
small cyst is seen at the 9 o'clock axis, subareolar, likely
corresponding as an incidental finding. No suspicious solid or
cystic mass is identified.
IMPRESSION: No mammographic or sonographic evidence of malignancy within either
breast. Small benign cysts within each breast.

RECOMMENDATION:
Screening mammogram at age 39 unless there are persistent or
intervening clinical concerns. (Code:AL-T-LP5)

I have discussed the findings and recommendations with the patient.
Results were also provided in writing at the conclusion of the
visit. If applicable, a reminder letter will be sent to the patient
regarding the next appointment.

BI-RADS CATEGORY  2: Benign.

## 2019-04-21 ENCOUNTER — Encounter (HOSPITAL_BASED_OUTPATIENT_CLINIC_OR_DEPARTMENT_OTHER): Payer: Self-pay | Admitting: *Deleted

## 2019-04-21 ENCOUNTER — Other Ambulatory Visit: Payer: Self-pay

## 2019-04-21 MED ORDER — SODIUM CHLORIDE 0.9 % IV SOLN
10.00 | INTRAVENOUS | Status: DC
Start: ? — End: 2019-04-21

## 2019-04-21 MED ORDER — GENERIC EXTERNAL MEDICATION
Status: DC
Start: ? — End: 2019-04-21

## 2019-04-21 NOTE — H&P (Signed)
Subjective: 38yo G0 at 6w by LMP presented to clinic 10/6 for follow up.  On 04/21/2019: Highly desired pregnancy, was seen in clinic 04/14/2019 with minimal spotting, positive UPT. Since then she notes, last Friday and Saturday spotting with urinating, on Sunday no bleeding and then on Monday she had more constant bleeding but not as heavy as a period. Denies any abdominal pain, nausea, vomiting, fever, chills.  She was seen last night/early this AM at [facility] ED for the bleeding, beta hcg and [place] concerning for ectopic but stable so she was told to follow up with [place] today.  Results from [facility] ED are under care everywhere  [place] at [facility] 04/21/2019 No IUP. Retroflexed Uterus 6.45x5.96x4.51cm, Left ovary 3.2x2.74x1.16cm, Right ovary 5.25x2.26x2.75cm, with corpus luteal cyst. Medial and superior to the left ovary is a vascular mass 2.9x2.5x1.5cm, no definitive fetal pole identified.  Today she reports: spotting, no pain. Denies any other concerns  No medical problems No history of abnormal pap smears or STIs No prior pregnancies No abdominal surgery  Past Medical History:  Diagnosis Date  . Ectopic pregnancy     Past Surgical History:  Procedure Laterality Date  . NO PAST SURGERIES      Medications Prior to Admission  Medication Sig Dispense Refill Last Dose  . Prenatal Vit-Fe Fumarate-FA (MULTIVITAMIN-PRENATAL) 27-0.8 MG TABS tablet Take 1 tablet by mouth daily at 12 noon.   Past Week at Unknown time   Not on File  Social History   Tobacco Use  . Smoking status: Never Smoker  . Smokeless tobacco: Never Used  Substance Use Topics  . Alcohol use: No    Alcohol/week: 0.0 standard drinks    Family History  Problem Relation Age of Onset  . Pancreatic cancer Father   . Breast cancer Neg Hx     Review of Systems Pertinent items are noted in HPI.   Objective: Vital signs in last 24 hours: Weight:  [54 kg] 54 kg (10/06 1750)  Ht 5\' 5"  (1.651 m)   Wt 54  kg   LMP 03/11/2019 Comment: 04-21-2019  ectopic preg  BMI 19.80 kg/m   General: no acute distress Abdomen: soft and nontender  Data Review:  CBC, T&S pending COVID test negative  Assessment/Plan: 38yo G0 at 6w with pregnancy of unknown location, likely ectopic pregnancy given beta hcg and US findings 1. Ectopic pregnancy: Beta HCG 10,460 04/21/2019 at 2am, [place] today with no IUP, Medial and superior to the left ovary is a vascular mass 2.9x2.5x1.5cm, no definitive fetal pole identified. Discussed recommendations to proceed with treatment given risk of an ectopic pregnancy. Discussed methotrexate but that the relative contraindications include beta hcg >5000 and mass >4cm. In addition this is a high desired pregnancy and we discussed the recommendation to prevent pregnancy 3 months after MTX administration.  Discussed surgery, including risks of bleeding, infection, damage to surrounding structures. After counseling, she decided to proceed with surgery. Consented for Diagnostic laparoscopy, removal of ectopic pregnancy, possible salpingectomy vs. oophorectomy, possible dilation and currettage. Blood type = O+ (per [facility] ED records). T&S and CBC pending Return in 2 weeks for postop visit. Activity restrictions for 4 weeks, given work excuse for 2 weeks.

## 2019-04-21 NOTE — Progress Notes (Signed)
Spoke w/ via phone for pre-op interview--- PT Lab needs dos----  Pre-Op orders pending, add-on after 1600 COVID test ------ pt has appt @ 0800 on 04-22-2019 Arrive at -------  1100 NPO after ------  [place] w/ exception clear liquids until 0700 (no cream/ milk products) then nothing by mouth Medications to take morning of surgery -- None Diabetic medication ----- n/a Patient Special Instructions ----- pt verbalized understanding after covid test done stay quarantined until she receives call from surgery center that test is negative and ok to arrive at [place]. Pre-Op special Istructions --- no  Patient verbalized understanding of instructions that were given at this phone interview. Patient denies shortness of breath, chest pain, fever, cough a this phone interview.

## 2019-04-22 ENCOUNTER — Other Ambulatory Visit: Payer: Self-pay

## 2019-04-22 ENCOUNTER — Ambulatory Visit (HOSPITAL_BASED_OUTPATIENT_CLINIC_OR_DEPARTMENT_OTHER): Payer: 59 | Admitting: Anesthesiology

## 2019-04-22 ENCOUNTER — Other Ambulatory Visit (HOSPITAL_COMMUNITY): Payer: Self-pay | Admitting: *Deleted

## 2019-04-22 ENCOUNTER — Encounter (HOSPITAL_BASED_OUTPATIENT_CLINIC_OR_DEPARTMENT_OTHER): Payer: Self-pay

## 2019-04-22 ENCOUNTER — Encounter (HOSPITAL_BASED_OUTPATIENT_CLINIC_OR_DEPARTMENT_OTHER)
Admission: RE | Disposition: A | Discharge status: Home / Self Care | Payer: Self-pay | Source: Home / Self Care | Attending: Obstetrics & Gynecology

## 2019-04-22 ENCOUNTER — Ambulatory Visit (HOSPITAL_BASED_OUTPATIENT_CLINIC_OR_DEPARTMENT_OTHER)
Admission: RE | Admit: 2019-04-22 | Discharge: 2019-04-22 | Disposition: A | Discharge status: Home / Self Care | Payer: 59 | Attending: Obstetrics & Gynecology | Admitting: Obstetrics & Gynecology

## 2019-04-22 ENCOUNTER — Other Ambulatory Visit (HOSPITAL_COMMUNITY)
Admission: RE | Admit: 2019-04-22 | Discharge: 2019-04-22 | Disposition: A | Discharge status: Home / Self Care | Payer: 59 | Source: Ambulatory Visit | Attending: Obstetrics & Gynecology | Admitting: Obstetrics & Gynecology

## 2019-04-22 DIAGNOSIS — Z9889 Other specified postprocedural states: Secondary | ICD-10-CM

## 2019-04-22 DIAGNOSIS — O009 Unspecified ectopic pregnancy without intrauterine pregnancy: Secondary | ICD-10-CM | POA: Diagnosis present

## 2019-04-22 DIAGNOSIS — Z20828 Contact with and (suspected) exposure to other viral communicable diseases: Secondary | ICD-10-CM | POA: Insufficient documentation

## 2019-04-22 DIAGNOSIS — N83291 Other ovarian cyst, right side: Secondary | ICD-10-CM | POA: Diagnosis not present

## 2019-04-22 HISTORY — DX: Unspecified ectopic pregnancy without intrauterine pregnancy: O00.90

## 2019-04-22 HISTORY — PX: DIAGNOSTIC LAPAROSCOPY WITH REMOVAL OF ECTOPIC PREGNANCY: SHX6449

## 2019-04-22 HISTORY — PX: LAPAROSCOPY: SHX197

## 2019-04-22 LAB — CBC
HCT: 38.8 % (ref 36.0–46.0)
Hemoglobin: 12.6 g/dL (ref 12.0–15.0)
MCH: 29.4 pg (ref 26.0–34.0)
MCHC: 32.5 g/dL (ref 30.0–36.0)
MCV: 90.4 fL (ref 80.0–100.0)
Platelets: 251 10*3/uL (ref 150–400)
RBC: 4.29 MIL/uL (ref 3.87–5.11)
RDW: 12.5 % (ref 11.5–15.5)
WBC: 5 10*3/uL (ref 4.0–10.5)
nRBC: 0 % (ref 0.0–0.2)

## 2019-04-22 LAB — TYPE AND SCREEN
ABO/RH(D): O POS
Antibody Screen: NEGATIVE

## 2019-04-22 LAB — ABO/RH: ABO/RH(D): O POS

## 2019-04-22 LAB — SARS CORONAVIRUS 2 BY RT PCR (HOSPITAL ORDER, PERFORMED IN ~~LOC~~ HOSPITAL LAB): SARS Coronavirus 2: NEGATIVE

## 2019-04-22 SURGERY — LAPAROSCOPY OPERATIVE
Anesthesia: General | Site: Abdomen

## 2019-04-22 SURGERY — LAPAROSCOPY, WITH ECTOPIC PREGNANCY SURGICAL TREATMENT
Anesthesia: General

## 2019-04-22 MED ORDER — FENTANYL CITRATE (PF) 100 MCG/2ML IJ SOLN
INTRAMUSCULAR | Status: AC
  Filled 2019-04-22: qty 2

## 2019-04-22 MED ORDER — DEXAMETHASONE SODIUM PHOSPHATE 10 MG/ML IJ SOLN
INTRAMUSCULAR | Status: DC | PRN
  Administered 2019-04-22: 5 mg via INTRAVENOUS

## 2019-04-22 MED ORDER — GENERIC EXTERNAL MEDICATION
Status: DC
Start: ? — End: 2019-04-22

## 2019-04-22 MED ORDER — LIDOCAINE-EPINEPHRINE 1 %-1:100000 IJ SOLN
INTRAMUSCULAR | Status: DC | PRN
  Administered 2019-04-22: 17 mL

## 2019-04-22 MED ORDER — ONDANSETRON HCL 4 MG/2ML IJ SOLN
INTRAMUSCULAR | Status: AC
  Filled 2019-04-22: qty 2

## 2019-04-22 MED ORDER — KETOROLAC TROMETHAMINE 30 MG/ML IJ SOLN
INTRAMUSCULAR | Status: DC | PRN
  Administered 2019-04-22: 30 mg via INTRAVENOUS

## 2019-04-22 MED ORDER — SUCCINYLCHOLINE CHLORIDE 200 MG/10ML IV SOSY
PREFILLED_SYRINGE | INTRAVENOUS | Status: AC
  Filled 2019-04-22: qty 10

## 2019-04-22 MED ORDER — PROMETHAZINE HCL 25 MG/ML IJ SOLN
6.2500 mg | INTRAMUSCULAR | Status: DC | PRN
  Filled 2019-04-22: qty 1

## 2019-04-22 MED ORDER — PROPOFOL 10 MG/ML IV BOLUS
INTRAVENOUS | Status: AC
  Filled 2019-04-22: qty 20

## 2019-04-22 MED ORDER — LACTATED RINGERS IV SOLN
INTRAVENOUS | Status: DC
  Administered 2019-04-22: 15:00:00 via INTRAVENOUS
  Filled 2019-04-22: qty 1000

## 2019-04-22 MED ORDER — 0.9 % SODIUM CHLORIDE (POUR BTL) OPTIME
TOPICAL | Status: DC | PRN
  Administered 2019-04-22: 500 mL

## 2019-04-22 MED ORDER — OXYCODONE HCL 5 MG PO TABS
5.0000 mg | ORAL_TABLET | Freq: Once | ORAL | Status: DC | PRN
  Filled 2019-04-22: qty 1

## 2019-04-22 MED ORDER — ONDANSETRON HCL 4 MG/2ML IJ SOLN
INTRAMUSCULAR | Status: DC | PRN
  Administered 2019-04-22: 4 mg via INTRAVENOUS

## 2019-04-22 MED ORDER — HYDROMORPHONE HCL 1 MG/ML IJ SOLN
0.2500 mg | INTRAMUSCULAR | Status: DC | PRN
  Administered 2019-04-22: 0.5 mg via INTRAVENOUS
  Filled 2019-04-22: qty 0.5

## 2019-04-22 MED ORDER — ROCURONIUM BROMIDE 10 MG/ML (PF) SYRINGE
PREFILLED_SYRINGE | INTRAVENOUS | Status: AC
  Filled 2019-04-22: qty 10

## 2019-04-22 MED ORDER — HYDROMORPHONE HCL 1 MG/ML IJ SOLN
INTRAMUSCULAR | Status: AC
  Filled 2019-04-22: qty 1

## 2019-04-22 MED ORDER — KETOROLAC TROMETHAMINE 30 MG/ML IJ SOLN
INTRAMUSCULAR | Status: AC
  Filled 2019-04-22: qty 1

## 2019-04-22 MED ORDER — SODIUM CHLORIDE 0.9 % IR SOLN
Status: DC | PRN
  Administered 2019-04-22: 1000 mL

## 2019-04-22 MED ORDER — LACTATED RINGERS IV SOLN
INTRAVENOUS | Status: DC
  Administered 2019-04-22: 11:00:00 via INTRAVENOUS
  Filled 2019-04-22: qty 1000

## 2019-04-22 MED ORDER — MIDAZOLAM HCL 2 MG/2ML IJ SOLN
INTRAMUSCULAR | Status: AC
  Filled 2019-04-22: qty 2

## 2019-04-22 MED ORDER — KETOROLAC TROMETHAMINE 30 MG/ML IJ SOLN
30.0000 mg | Freq: Once | INTRAMUSCULAR | Status: DC | PRN
  Filled 2019-04-22: qty 1

## 2019-04-22 MED ORDER — OXYCODONE HCL 5 MG/5ML PO SOLN
5.0000 mg | Freq: Once | ORAL | Status: DC | PRN
  Filled 2019-04-22: qty 5

## 2019-04-22 MED ORDER — DEXAMETHASONE SODIUM PHOSPHATE 10 MG/ML IJ SOLN
INTRAMUSCULAR | Status: AC
  Filled 2019-04-22: qty 1

## 2019-04-22 MED ORDER — ACETAMINOPHEN 500 MG PO TABS
1000.0000 mg | ORAL_TABLET | Freq: Once | ORAL | Status: AC
  Administered 2019-04-22: 1000 mg via ORAL
  Filled 2019-04-22: qty 2

## 2019-04-22 MED ORDER — PROPOFOL 10 MG/ML IV BOLUS
INTRAVENOUS | Status: DC | PRN
  Administered 2019-04-22: 150 mg via INTRAVENOUS
  Administered 2019-04-22: 50 mg via INTRAVENOUS
  Administered 2019-04-22: 30 mg via INTRAVENOUS

## 2019-04-22 MED ORDER — FENTANYL CITRATE (PF) 100 MCG/2ML IJ SOLN
INTRAMUSCULAR | Status: DC | PRN
  Administered 2019-04-22: 100 ug via INTRAVENOUS

## 2019-04-22 MED ORDER — ROCURONIUM BROMIDE 10 MG/ML (PF) SYRINGE
PREFILLED_SYRINGE | INTRAVENOUS | Status: DC | PRN
  Administered 2019-04-22: 40 mg via INTRAVENOUS

## 2019-04-22 MED ORDER — FAMOTIDINE 20 MG PO TABS
20.0000 mg | ORAL_TABLET | Freq: Once | ORAL | Status: AC
  Administered 2019-04-22: 20 mg via ORAL
  Filled 2019-04-22: qty 1

## 2019-04-22 MED ORDER — IBUPROFEN 800 MG PO TABS: 800.0000 mg | ORAL_TABLET | Freq: Three times a day (TID) | ORAL | 0 refills | Status: AC | PRN

## 2019-04-22 MED ORDER — MIDAZOLAM HCL 2 MG/2ML IJ SOLN
INTRAMUSCULAR | Status: DC | PRN
  Administered 2019-04-22: 2 mg via INTRAVENOUS

## 2019-04-22 MED ORDER — LIDOCAINE 2% (20 MG/ML) 5 ML SYRINGE
INTRAMUSCULAR | Status: DC | PRN
  Administered 2019-04-22: 60 mg via INTRAVENOUS

## 2019-04-22 MED ORDER — WHITE PETROLATUM EX OINT
TOPICAL_OINTMENT | CUTANEOUS | Status: AC
  Filled 2019-04-22: qty 5

## 2019-04-22 MED ORDER — SCOPOLAMINE 1 MG/3DAYS TD PT72
1.0000 | MEDICATED_PATCH | TRANSDERMAL | Status: DC
  Administered 2019-04-22: 12:00:00 1.5 mg via TRANSDERMAL
  Filled 2019-04-22: qty 1

## 2019-04-22 MED ORDER — LIDOCAINE 2% (20 MG/ML) 5 ML SYRINGE
INTRAMUSCULAR | Status: AC
  Filled 2019-04-22: qty 5

## 2019-04-22 SURGICAL SUPPLY — 32 items
CABLE HIGH FREQUENCY MONO STRZ (ELECTRODE) ×3 IMPLANT
COVER WAND RF STERILE (DRAPES) ×3 IMPLANT
DERMABOND ADVANCED (GAUZE/BANDAGES/DRESSINGS) ×1
DERMABOND ADVANCED .7 DNX12 (GAUZE/BANDAGES/DRESSINGS) ×2 IMPLANT
GLOVE BIOGEL PI IND STRL 6 (GLOVE) ×4 IMPLANT
GLOVE BIOGEL PI IND STRL 7.0 (GLOVE) ×4 IMPLANT
GLOVE BIOGEL PI INDICATOR 6 (GLOVE) ×2
GLOVE BIOGEL PI INDICATOR 7.0 (GLOVE) ×2
GLOVE ECLIPSE 6.0 STRL STRAW (GLOVE) ×3 IMPLANT
GOWN STRL REUS W/ TWL LRG LVL3 (GOWN DISPOSABLE) ×4 IMPLANT
GOWN STRL REUS W/TWL LRG LVL3 (GOWN DISPOSABLE) ×2
KIT TURNOVER KIT B (KITS) ×3 IMPLANT
LIGASURE VESSEL 5MM BLUNT TIP (ELECTROSURGICAL) ×3 IMPLANT
NS IRRIG 1000ML POUR BTL (IV SOLUTION) ×3 IMPLANT
PACK LAPAROSCOPY BASIN (CUSTOM PROCEDURE TRAY) ×3 IMPLANT
PACK TRENDGUARD 450 HYBRID PRO (MISCELLANEOUS) IMPLANT
POUCH SPECIMEN RETRIEVAL 10MM (ENDOMECHANICALS) IMPLANT
PROTECTOR NERVE ULNAR (MISCELLANEOUS) ×6 IMPLANT
SCISSORS LAP 5X45 EPIX DISP (ENDOMECHANICALS) ×3 IMPLANT
SET IRRIG TUBING LAPAROSCOPIC (IRRIGATION / IRRIGATOR) ×3 IMPLANT
SET TUBE SMOKE EVAC HIGH FLOW (TUBING) ×3 IMPLANT
SLEEVE ENDOPATH XCEL 5M (ENDOMECHANICALS) IMPLANT
SUT MNCRL AB 3-0 PS2 27 (SUTURE) ×3 IMPLANT
SUT VICRYL 0 UR6 27IN ABS (SUTURE) ×3 IMPLANT
SYS BAG RETRIEVAL 10MM (BASKET) ×3
SYSTEM BAG RETRIEVAL 10MM (BASKET) ×2 IMPLANT
TOWEL GREEN STERILE FF (TOWEL DISPOSABLE) ×6 IMPLANT
TRAY FOLEY W/BAG SLVR 14FR (SET/KITS/TRAYS/PACK) ×3 IMPLANT
TRENDGUARD 450 HYBRID PRO PACK (MISCELLANEOUS)
TROCAR XCEL NON-BLD 11X100MML (ENDOMECHANICALS) ×3 IMPLANT
TROCAR XCEL NON-BLD 5MMX100MML (ENDOMECHANICALS) ×6 IMPLANT
WARMER LAPAROSCOPE (MISCELLANEOUS) ×3 IMPLANT

## 2019-04-22 NOTE — Discharge Instructions (Signed)
Pain management after surgery: Prescription provided for motrin 800mg  to take every 8 hours as needed May use Tylenol every 6-8 hours needed also- start after 6 pm Expect some vaginal spotting No intercourse until after postop visit Return in 1 week for lab draw   Minimally Invasive Surgery, Care After This sheet gives you information about how to care for yourself after your procedure. Your health care provider may also give you more specific instructions. If you have problems or questions, contact your health care provider. What can I expect after the procedure? After the procedure, it is common to have:  Mild cramping in your lower abdomen.  Tiredness (fatigue). You may feel tired for a few days. Follow these instructions at home: Incision care   Follow instructions from your health care provider about how to take care of your incisions. Make sure you: ? Wash your hands with soap and water before and after you change your bandage (dressing). If soap and water are not available, use hand sanitizer. ? Change your dressing as told by your health care provider. ? Leave stitches (sutures), skin glue, or adhesive strips in place. These skin closures may need to stay in place for 2 weeks or longer. If adhesive strip edges start to loosen and curl up, you may trim the loose edges. Do not remove adhesive strips completely unless your health care provider tells you to do that.  Check your incision areas every day for signs of infection. Check for: ? Redness, swelling, or pain that gets worse. ? Fluid or blood. ? Warmth. ? Pus or a bad smell.  Keep your incision areas clean and dry.  Do not take baths, swim, or use a hot tub until your health care provider approves. Ask your health care provider if you may take showers. You may only be allowed to take sponge baths. Activity  Return to your normal activities as told by your health care provider. Ask your health care provider what activities  are safe for you. Ask about: ? Returning to work and exercise. ? Any weight restrictions you may have. ? When you can resume sexual activity.  Rest as told by your health care provider.  Avoid sitting for a long time without moving. Get up to take short walks every 1-2 hours. This is important to improve blood flow and breathing. Ask for help if you feel weak or unsteady. General instructions  Take over-the-counter and prescription medicines only as told by your health care provider.  Ask your health care provider if the medicine prescribed to you: ? Requires you to avoid driving or using heavy machinery. ? Can cause constipation. You may need to take these actions to prevent or treat constipation:  Drink enough fluid to keep your urine pale yellow.  Take over-the-counter or prescription medicines.  Eat foods that are high in fiber, such as beans, whole grains, and fresh fruits and vegetables.  Limit foods that are high in fat and processed sugars, such as fried or sweet foods.  Keep all follow-up visits as told by your health care provider. This is important. Contact a health care provider if:  You have unusual bleeding or discharge from your vagina.  You have pain in your abdomen that gets worse or does not get better with medicine.  You feel nauseous.  You have redness, swelling, or pain around any of your incisions.  You have fluid or blood coming from any of your incisions.  Any of your incisions feel warm  to the touch.  You have pus or a bad smell coming from any of your incisions. Get help right away if:  You have a fever.  You have a lot of unusual bleeding or discharge from your vagina.  You have severe pain in your abdomen.  You cannot stop vomiting.  You have nausea that does not get better.  You faint.  You are unable to empty your bladder. Summary  After the procedure, it is common to have mild cramping and to feel tired for a few days.  Return  to your normal activities as told by your health care provider. Ask your health care provider what activities are safe for you.  Contact a health care provider if you have complications, such as unusual bleeding or discharge, nausea, pain that gets worse, or fluid or blood coming from any of your incisions.  Keep all follow-up visits as told by your health care provider. This information is not intended to replace advice given to you by your health care provider. Make sure you discuss any questions you have with your health care provider. Document Released: 06/21/2011 Document Revised: 09/17/2018 Document Reviewed: 09/17/2018 Elsevier Patient Education  [place] Instructions  Activity: Get plenty of rest for the remainder of the day. A responsible individual must stay with you for 24 hours following the procedure.  For the next 24 hours, DO NOT: -Drive a car -Paediatric nurse -Drink alcoholic beverages -Take any medication unless instructed by your physician -Make any legal decisions or sign important papers.  Meals: Start with liquid foods such as gelatin or soup. Progress to regular foods as tolerated. Avoid greasy, spicy, heavy foods. If nausea and/or vomiting occur, drink only clear liquids until the nausea and/or vomiting subsides. Call your physician if vomiting continues.  Special Instructions/Symptoms: Your throat may feel dry or sore from the anesthesia or the breathing tube placed in your throat during surgery. If this causes discomfort, gargle with warm salt water. The discomfort should disappear within 24 hours.  If you had a scopolamine patch placed behind your ear for the management of post- operative nausea and/or vomiting:  1. The medication in the patch is effective for 72 hours, after which it should be removed.  Wrap patch in a tissue and discard in the trash. Wash hands thoroughly with soap and water. 2. You may remove the patch  earlier than 72 hours if you experience unpleasant side effects which may include dry mouth, dizziness or visual disturbances. 3. Avoid touching the patch. Wash your hands with soap and water after contact with the patch.

## 2019-04-22 NOTE — Transfer of Care (Signed)
Immediate Anesthesia Transfer of Care Note  Patient: Grace Powell  Procedure(s) Performed: DIAGNOSTIC LAPAROSCOPY (N/A Abdomen) REMOVAL OF ECTOPIC WITH PARTIAL SALPINGECTOMY (Left Abdomen)  Patient Location: PACU  Anesthesia Type:General  Level of Consciousness: awake, alert , oriented and patient cooperative  Airway & Oxygen Therapy: Patient Spontanous Breathing and Patient connected to nasal cannula oxygen  Post-op Assessment: Report given to RN and Post -op Vital signs reviewed and stable  Post vital signs: Reviewed and stable  Last Vitals:  Vitals Value Taken Time  BP 97/41 04/22/19 1325  Temp    Pulse 105 04/22/19 1326  Resp 23 04/22/19 1326  SpO2 100 % 04/22/19 1326  Vitals shown include unvalidated device data.  Last Pain:  Vitals:   04/22/19 1129  TempSrc: Oral  PainSc: 0-No pain      Patients Stated Pain Goal: 5 (65/[id number]/46 [id number])  Complications: No apparent anesthesia complications

## 2019-04-22 NOTE — Anesthesia Procedure Notes (Signed)
Procedure Name: Intubation Date/Time: 04/22/2019 12:11 PM Performed by: Wanita Chamberlain, CRNA Pre-anesthesia Checklist: Patient identified, Emergency Drugs available, Suction available, Patient being monitored and Timeout performed Patient Re-evaluated:Patient Re-evaluated prior to induction Oxygen Delivery Method: Circle system utilized Preoxygenation: Pre-oxygenation with 100% oxygen Induction Type: IV induction Ventilation: Mask ventilation without difficulty Laryngoscope Size: Mac and 3 Grade View: Grade II Tube type: Oral Tube size: 7.0 mm Number of attempts: 1 Airway Equipment and Method: Stylet

## 2019-04-22 NOTE — Brief Op Note (Signed)
04/22/2019  1:15 PM  PATIENT:  Grace Powell  38 y.o. female  PRE-OPERATIVE DIAGNOSIS:  ECTOPIC PREGNANCY, HIGH HCG LEVEL  POST-OPERATIVE DIAGNOSIS:  ECTOPIC PREGNANCY, HIGH HCG LEVEL  PROCEDURE:  Procedure(s): DIAGNOSTIC LAPAROSCOPY (N/A) REMOVAL OF ECTOPIC WITH PARTIAL SALPINGECTOMY (Left)  SURGEON:  Surgeon(s) and Role:    * Taam-Akelman, Lawrence Santiago, MD - Primary    * Jerelyn Charles, MD - Assisting  ANESTHESIA:   general  EBL:  minimal   BLOOD ADMINISTERED:none  DRAINS: none   LOCAL MEDICATIONS USED:  LIDOCAINE   SPECIMEN:  Source of Specimen:  left ectopic pregnancy  DISPOSITION OF SPECIMEN:  PATHOLOGY  COUNTS:  YES  TOURNIQUET:  * No tourniquets in log *  DICTATION: .Note written in EPIC  PLAN OF CARE: Discharge to home after PACU  PATIENT DISPOSITION:  PACU - hemodynamically stable.   Delay start of Pharmacological VTE agent (>24hrs) due to surgical blood loss or risk of bleeding: not applicable

## 2019-04-22 NOTE — Anesthesia Preprocedure Evaluation (Addendum)
Anesthesia Evaluation  Patient identified by MRN, date of birth, ID band Patient awake    Reviewed: Allergy & Precautions, NPO status , Patient's Chart, lab work & pertinent test results  History of Anesthesia Complications (+) DIFFICULT AIRWAY  Airway Mallampati: III  TM Distance: >3 FB Neck ROM: Full    Dental no notable dental hx. (+) Teeth Intact, Dental Advisory Given,    Pulmonary neg pulmonary ROS,    Pulmonary exam normal breath sounds clear to auscultation       Cardiovascular negative cardio ROS Normal cardiovascular exam Rhythm:Regular Rate:Normal     Neuro/Psych negative neurological ROS  negative psych ROS   GI/Hepatic negative GI ROS, Neg liver ROS,   Endo/Other  negative endocrine ROS  Renal/GU negative Renal ROS     Musculoskeletal negative musculoskeletal ROS (+)   Abdominal   Peds  Hematology negative hematology ROS (+)   Anesthesia Other Findings ectopic  high hcg level  Reproductive/Obstetrics                           Anesthesia Physical Anesthesia Plan  ASA: I  Anesthesia Plan: General   Post-op Pain Management:    Induction: Intravenous  PONV Risk Score and Plan: 4 or greater and Scopolamine patch - Pre-op, Midazolam, Dexamethasone, Ondansetron and Treatment may vary due to age or medical condition  Airway Management Planned: Oral ETT  Additional Equipment:   Intra-op Plan:   Post-operative Plan: Extubation in OR  Informed Consent: I have reviewed the patients History and Physical, chart, labs and discussed the procedure including the risks, benefits and alternatives for the proposed anesthesia with the patient or authorized representative who has indicated his/her understanding and acceptance.     Dental advisory given  Plan Discussed with: CRNA  Anesthesia Plan Comments:        Anesthesia Quick Evaluation

## 2019-04-22 NOTE — Op Note (Signed)
OPERATIVE NOTE [facility]  PATIENT:  Grace Powell  38 y.o. female  PRE-OPERATIVE DIAGNOSIS:  ECTOPIC PREGNANCY, HIGH HCG LEVEL  POST-OPERATIVE DIAGNOSIS:  ECTOPIC PREGNANCY, HIGH HCG LEVEL  PROCEDURE:  Procedure(s): DIAGNOSTIC LAPAROSCOPY (N/A) REMOVAL OF ECTOPIC WITH PARTIAL SALPINGECTOMY (Left)  SURGEON:  Surgeon(s) and Role:    * Taam-Akelman, Griselda Miner, MD - Primary    * Marlow Baars, MD - Assisting  ANESTHESIA:   general  EBL:  minimal   BLOOD ADMINISTERED:none  DRAINS: none   LOCAL MEDICATIONS USED:  LIDOCAINE   SPECIMEN:  Source of Specimen:  left ectopic pregnancy  DISPOSITION OF SPECIMEN:  PATHOLOGY  COUNTS:  YES  TOURNIQUET:  * No tourniquets in log *  PLAN OF CARE: Discharge to home after PACU  PATIENT DISPOSITION:  PACU - hemodynamically stable.  Findings:  Exam under anesthesia revealed small mobile uterus, minimal bleeding. Diagnostic laparoscopy revealed a left adnexal mass consistent with ectopic pregnancy, appeared to be at the fimbria. Right ovary with simple cyst.  Description of procedure: After consent was verified the patient was taken to the operating room where general anesthesia was administered without difficulty.  The patient was placed in the dorsal lithotomy position using Allen stirrups.  Exam under anesthesia was performed revealing a small mobile uterus.  The abdomen and vagina were subsequently prepped and draped in the normal sterile fashion.  Foley catheter was placed.  A speculum was placed in the patient's vagina and the anterior lip of the cervix was grasped with a single-tooth tenaculum and Hulka uterine manipulator was advanced to the cervix and secured to the cervix.  The speculum was removed from the vagina.    Attention was then turned to the patient's abdomen where a 2cc lidocaine with epinephrine was injected and then a 5 mm vertical skin incision was made at the base of the umbilicus.   The abdominal wall was tented and the 5 mm Optiview trocar and sleeve were advanced without difficulty into the abdomen where intra-abdominal placement was confirmed by the laparoscope.  A second 5 mm skin incision was made approximately 4 cm above the left anterior superior iliac spine.  The second trocar and sleeve were then advanced under direct visualization.  The patient was then placed in Trendelenburg and a blunt probe was used to sweep the bowel back.  On the left adnexa there was a mass consistent with an ectopic pregnancy which appeared to be at the fimbria.  On the right ovary there was a simple cyst.  Bilateral ovaries otherwise appeared normal.  No adhesions noted on either tube. The umbilical incision was extended for a 38mm trocar.  A third 5 mm skin incision was made and a trocar was placed in a similar fashion approximately 4 cm above the right anterior superior iliac spine.  The LigaSure was used to remove the left ectopic pregnancy at the level of the fimbria.  The fimbriae appeared hemostatic, unclear if this will be a patent tube in the future.  On the right ovary there was a simple appearing cyst but given the ultrasound with an enlarged ovary and increased vascularity decision was made to incise the cyst with monopolar scissors, this confirmed that it was a simple cyst and the edge was made hemostatic with monopolar scissors.  Suction irrigation was performed, the left and right adnexa appeared hemostatic. The ectopic pregnancy was placed in an endocatch bag and removed through the umbilicus and sent to pathology. All instruments were removed  from the patient's abdomen.  The umbilical fascia was closed using vicryl on a UR 6 in a running fashion. Skin incisions were closed with Vicryl. 10 cc of lidocaine was injected for local at the end of the procedure.  The Foley was removed.  The uterine manipulator had already been removed during the procedure.  The patient tolerated the procedure well  and was taken to the recovery room in a stable condition.  Sponge lap and needle counts were correct.  [facility] 04/22/19 2:41 PM

## 2019-04-23 ENCOUNTER — Encounter (HOSPITAL_BASED_OUTPATIENT_CLINIC_OR_DEPARTMENT_OTHER): Payer: Self-pay | Admitting: Obstetrics & Gynecology

## 2019-04-23 LAB — SURGICAL PATHOLOGY

## 2019-04-23 NOTE — Anesthesia Postprocedure Evaluation (Signed)
Anesthesia Post Note  Patient: Grace Powell  Procedure(s) Performed: DIAGNOSTIC LAPAROSCOPY (N/A Abdomen) REMOVAL OF ECTOPIC WITH PARTIAL SALPINGECTOMY (Left Abdomen)     Patient location during evaluation: PACU Anesthesia Type: General Level of consciousness: awake and alert Pain management: pain level controlled Vital Signs Assessment: post-procedure vital signs reviewed and stable Respiratory status: spontaneous breathing, nonlabored ventilation, respiratory function stable and patient connected to nasal cannula oxygen Cardiovascular status: blood pressure returned to baseline and stable Postop Assessment: no apparent nausea or vomiting Anesthetic complications: no    Last Vitals:  Vitals:   04/22/19 1440 04/22/19 1456  BP: 116/62 117/63  Pulse: 90 91  Resp: 20 14  Temp:  36.9 C  SpO2: 100% 100%    Last Pain:  Vitals:   04/22/19 1456  TempSrc:   PainSc: 0-No pain                 Timberlyn Pickford P Fatemah Pourciau

## 2021-11-01 LAB — HEMOGLOBIN A1C
Estimated Avg Glucose, External: 107 mg/dL (ref 91–123)
Hemoglobin A1C, External: 5.4 % (ref 4.8–5.6)

## 2022-07-18 ENCOUNTER — Encounter
Payer: PRIVATE HEALTH INSURANCE | Attending: Student in an Organized Health Care Education/Training Program | Primary: Student in an Organized Health Care Education/Training Program

## 2022-07-20 ENCOUNTER — Encounter
Admit: 2022-07-20 | Discharge: 2022-07-20 | Payer: PRIVATE HEALTH INSURANCE | Attending: Student in an Organized Health Care Education/Training Program | Primary: Student in an Organized Health Care Education/Training Program

## 2022-07-20 DIAGNOSIS — Z7689 Persons encountering health services in other specified circumstances: Secondary | ICD-10-CM

## 2022-07-20 NOTE — Progress Notes (Signed)
History of Present Illness  Cassandra George is a 42 y.o. female who presents today for management of    Chief Complaint   Patient presents with    New Patient     CC: new patient here for healthcare maintenance    -Patient is here to establish care.   -Previous PCP: [facility], last visit 1 year ago    -Works at [employer]  -She lives at home with fiance   -Pt reports good support system with family/friends and feels safe at home.    Healthcare maintenance:  Immunizations:  Flu vacc: overdue   TDaP vacc: overdue  COVID vacc: overdue  Last Pap: UTD  Last Mammo: UTD  Last Colonoscopy: never done  Sexual health: Pt would like STI screening  Mood: Reports good mood overall      07/20/2022     3:54 PM   PHQ-9    Little interest or pleasure in doing things 0   Feeling down, depressed, or hopeless 0   PHQ-2 Score 0   PHQ-9 Total Score 0       Past Medical History  History reviewed. No pertinent past medical history.   Surgical History  History reviewed. No pertinent surgical history.   Current Medications  No current outpatient medications on file.     No current facility-administered medications for this visit.     Allergies/Drug Reactions  No Known Allergies   Family History  History reviewed. No pertinent family history.   Social History  Social History     Tobacco Use    Smoking status: Never    Smokeless tobacco: Never   Substance Use Topics    Alcohol use: Yes    Drug use: Not Currently      Health Maintenance   Topic Date Due    Hepatitis B vaccine (1 of 3 - 3-dose series) Never done    Varicella vaccine (1 of 2 - 2-dose childhood series) Never done    HIV screen  Never done    Hepatitis C screen  Never done    Cervical cancer screen  Never done    Lipids  Never done    COVID-19 Vaccine (4 - 2023-24 season) 03/16/2022    Depression Screen  07/21/2023    DTaP/Tdap/Td vaccine (2 - Td or Tdap) 07/20/2032    Flu vaccine  Completed    Hepatitis A vaccine  Aged Out    Hib vaccine  Aged Out     HPV vaccine  Aged Out    Polio vaccine  Aged Out    Meningococcal (ACWY) vaccine  Aged Out    Pneumococcal 0-64 years Vaccine  Aged [employer] History   Administered Date(s) Administered    COVID-19, PFIZER PURPLE top, DILUTE for use, (age 9 y+), 34mcg/0.3mL 11/09/2019, 11/30/2019, 07/02/2020    Influenza, FLUCELVAX, (age 79 mo+), MDCK, PF, 0.54mL 07/20/2022    TDaP, ADACEL (age 29y-64y), Durwin Reges (age 10y+), IM, 0.50mL 07/20/2022       Review of Systems  Review of Systems   Constitutional:  Positive for fatigue. Negative for chills and fever.   Respiratory:  Negative for shortness of breath.    Cardiovascular:  Negative for chest pain.   Neurological:  Negative for dizziness, light-headedness and headaches.   Psychiatric/Behavioral:  Negative for dysphoric mood.           Physical Exam  Vital signs:   Vitals:    07/20/22 1557  BP: 113/73   Site: Left Upper Arm   Position: Sitting   Cuff Size: Medium Adult   Pulse: 86   Resp: 18   Temp: 97.3 F (36.3 C)   TempSrc: Temporal   SpO2: 99%   Weight: 63.7 kg (140 lb 6.4 oz)   Height: 1.6 m (5\' 3" )     General: alert, oriented, not in distress  Chest/Lungs: clear breath sounds, no wheezing or crackles  Heart: normal rate, regular rhythm, no murmur  Extremities: no focal deformities, no edema  Skin: no active skin lesions    Assessment/Plan:    [name] was seen today for new patient.    Diagnoses and all orders for this visit:    Encounter to establish care  -     Comprehensive Metabolic Panel; Future  -     CBC with Auto Differential; Future    Hypercholesterolemia  -     Lipid Panel; Future    Diabetes mellitus screening  -     HEMOGLOBIN A1C W/O EAG; Future    Fatigue, unspecified type  -     Vitamin D 25 Hydroxy; Future  -     TSH; Future  -     Vitamin B12; Future    Need for vaccination  -     Tdap, BOOSTRIX, (age 30 yrs+), IM  -     Influenza, FLUCELVAX, (age 76 mo+), IM, Preservative Free, 0.5 mL    Encounter for hepatitis C screening test for low risk  patient  -     Hepatitis C Antibody; Future    Routine screening for STI (sexually transmitted infection)  -     HIV 1/2 Ag/Ab, 4TH Generation,W Rflx Confirm; Future  -     RPR; Future  -     Chlamydia, Gonorrhea, Trichomoniasis; Future      On this date 07/20/2022 I have spent 20 minutes reviewing previous notes, test results and face to face with the patient discussing the diagnosis and importance of compliance with the treatment plan as well as documenting on the day of the visit.    I have discussed the diagnosis with the patient and the intended plan as seen in the above orders.  The patient has received an after-visit summary and questions were answered concerning future plans.  I have discussed medication side effects and warnings with the patient as well. I have reviewed the plan of care with the patient, accepted their input and they are in agreement with the treatment goals.     Follow-up and Dispositions    Return in about 3 months (around 10/19/2022).       Arrie Aran, MD  July 20, 2022

## 2022-07-20 NOTE — Progress Notes (Signed)
Cassandra George is a 42 y.o. presents today for   Chief Complaint   Patient presents with    New Patient     Is someone accompanying this pt? no    Is the patient using any DME equipment during OV? no  There were no vitals filed for this visit.    Depression Screening:       07/20/2022     3:54 PM   PHQ-9 Questionaire   Little interest or pleasure in doing things 0   Feeling down, depressed, or hopeless 0   PHQ-9 Total Score 0        Abuse Screening:       No data to display                 Learning Assessment Screening:   No question data found.     Fall Risk Screening:        No data to display                    Health Maintenance: reviewed and discussed and ordered per Provider.    Health Maintenance Due   Topic Date Due    Hepatitis B vaccine (1 of 3 - 3-dose series) Never done    Varicella vaccine (1 of 2 - 2-dose childhood series) Never done    Depression Screen  Never done    HIV screen  Never done    Hepatitis C screen  Never done    DTaP/Tdap/Td vaccine (1 - Tdap) Never done    Cervical cancer screen  Never done    Lipids  Never done    Flu vaccine (1) Never done    COVID-19 Vaccine (4 - 2023-24 season) 03/16/2022         Coordination of Care:   1. "Have you been to the ER, urgent care clinic since your last visit?  Hospitalized since your last visit?" no    2. "Have you seen or consulted any other health care providers outside of the [place] since your last visit?" no    3. For patients aged 64-75: Has the patient had a colonoscopy / FIT/ Cologuard?NA - based on age or sex  If the patient is female:    65. For patients aged 33-74: Has the patient had a mammogram within the past 2 years? Yes - no Care Gap present    5. For patients aged 21-65: Has the patient had a pap smear? Yes - no Care Gap present    Advanced Directive:  1. Do you have an Advanced Directive? No     2. Would you like information on Advanced Directives? No

## 2022-07-25 ENCOUNTER — Encounter
Admit: 2022-07-25 | Payer: PRIVATE HEALTH INSURANCE | Primary: Student in an Organized Health Care Education/Training Program

## 2022-07-27 LAB — LIPID PANEL
Chol/HDL Ratio: 5.2 (ref 0.0–5.0)
Cholesterol: 232 mg/dL — ABNORMAL HIGH (ref 110–200)
HDL: 45 mg/dL (ref >=40)
LDL Calculated: 179 mg/dL — ABNORMAL HIGH (ref 50–99)
LDL/HDL Ratio: 4
Non-HDL Cholesterol: 187 mg/dL — ABNORMAL HIGH (ref <130)
Triglycerides: 39 mg/dL — ABNORMAL LOW (ref 40–149)
VLDL Cholesterol Calculated: 8 mg/dL (ref 8–30)

## 2022-07-27 LAB — COMPREHENSIVE METABOLIC PANEL
ALT: 14 U/L (ref 5–40)
AST: 21 U/L (ref 10–37)
Albumin/Globulin Ratio: 1.7 ratio (ref 1.1–2.6)
Albumin: 4.6 g/dL (ref 3.5–5.0)
Alkaline Phosphatase: 110 U/L (ref 25–115)
Anion Gap: 9 mmol/L (ref 3.0–15.0)
BUN: 12 mg/dL (ref 6–22)
CO2: 25 mmol/L (ref 20–32)
Calcium: 9.2 mg/dL (ref 8.4–10.5)
Chloride: 106 mmol/L (ref 98–110)
Creatinine: 0.6 mg/dL (ref 0.5–1.2)
Globulin: 2.7 g/dL (ref 2.0–4.0)
Glomerular Filtration Rate: >60 mL/min/{1.73_m2} (ref >60.0)
Glucose: 88 mg/dL (ref 70–99)
Potassium: 4.2 mmol/L (ref 3.5–5.5)
Sodium: 140 mmol/L (ref 133–145)
Total Bilirubin: 0.2 mg/dL (ref 0.2–1.2)
Total Protein: 7.3 g/dL (ref 6.4–8.3)

## 2022-07-27 LAB — CBC WITH AUTO DIFFERENTIAL
Basophils Absolute: 0 10*3/uL (ref 0.0–0.2)
Basophils: 1 % (ref 0–2)
Eosinophils Absolute: 0.1 10*3/uL (ref 0.0–0.5)
Eosinophils: 2 % (ref 0–6)
Hematocrit: 42.4 % (ref 35.1–46.5)
Hemoglobin: 13.1 g/dL (ref 11.7–15.5)
Lymphocytes Absolute: 1.4 10*3/uL (ref 1.0–4.8)
Lymphocytes: 40 % (ref 20–45)
MCH: 28 pg (ref 26–34)
MCHC: 31 g/dL (ref 31–36)
MCV: 90 fL (ref 80–99)
MPV: 10.2 fL (ref 9.0–13.0)
Monocytes Absolute: 0.3 10*3/uL (ref 0.1–1.0)
Monocytes: 7 % (ref 3–12)
Neutrophils Absolute: 1.7 10*3/uL — ABNORMAL LOW (ref 1.8–7.7)
Neutrophils: 50 % (ref 40–75)
Platelets: 312 10*3/uL (ref 140–440)
RBC: 4.71 M/uL (ref 3.80–5.20)
RDW: 12.7 % (ref 10.0–15.5)
WBC: 3.5 10*3/uL — ABNORMAL LOW (ref 4.0–11.0)

## 2022-07-27 LAB — HIV 1/2 AG/AB, 4TH GENERATION,W RFLX CONFIRM: HIV -1/0/2 Ag/Ab with Reflex: NONREACTIVE

## 2022-07-27 LAB — HEMOGLOBIN A1C
AVERAGE GLUCOSE: 106 mg/dL (ref 91–123)
Estimated Avg Glucose, External: 106 mg/dL (ref 91–123)
Hemoglobin A1C, External: 5.3 % (ref 4.8–5.6)
Hemoglobin A1C: 5.3 % (ref 4.8–5.6)

## 2022-07-27 LAB — HEPATITIS C ANTIBODY: Hepatitis C Ab: NOT DETECTED

## 2022-07-27 LAB — RPR: T. pallidum, IgG/IgM: NONREACTIVE

## 2022-07-27 LAB — VITAMIN B12: Vitamin B-12: 564 pg/mL (ref 211–911)

## 2022-07-27 LAB — CHLAMYDIA/N. GONORRHOEAE/T. VAGINALIS, AMPLIFIED PROBE(UR)
Chlamydia amplified,urine: NEGATIVE
GC Amplified Urine: NEGATIVE
TRICH NUCU: NEGATIVE

## 2022-07-27 LAB — TSH: TSH: 2.26 uU/mL (ref 0.27–4.20)

## 2022-07-27 LAB — VITAMIN D 25 HYDROXY: Vit D, 25-Hydroxy: 22.3 ng/mL — ABNORMAL LOW (ref 32.0–100.0)

## 2022-08-10 ENCOUNTER — Encounter

## 2022-08-10 MED ORDER — VITAMIN D (ERGOCALCIFEROL) 1.25 MG (50000 UT) PO CAPS
1.2550000 MG (50000 UT) | ORAL_CAPSULE | ORAL | 1 refills | Status: DC
Start: 2022-08-10 — End: 2023-01-24

## 2022-10-19 ENCOUNTER — Encounter
Admit: 2022-10-19 | Discharge: 2022-10-19 | Payer: PRIVATE HEALTH INSURANCE | Attending: Student in an Organized Health Care Education/Training Program | Primary: Student in an Organized Health Care Education/Training Program

## 2022-10-19 DIAGNOSIS — R079 Chest pain, unspecified: Secondary | ICD-10-CM

## 2022-10-19 NOTE — Progress Notes (Signed)
Cassandra George is a 42 y.o. year old female who presents today for   Chief Complaint   Patient presents with    Follow-up       Is someone accompanying this pt? no    Is the patient using any DME equipment during OV? no    Depression Screening:       10/19/2022     9:35 AM 07/20/2022     3:54 PM   PHQ-9 Questionaire   Little interest or pleasure in doing things 0 0   Feeling down, depressed, or hopeless 0 0   PHQ-9 Total Score 0 0       Abuse Screening:       No data to display                Learning Assessment:  No question data found.    Fall Risk:       No data to display                    Coordination of Care:   1. "Have you been to the ER, urgent care clinic since your last visit?  Hospitalized since your last visit?" no    2. "Have you seen or consulted any other health care providers outside of the [facility] since your last visit?" no    3. For patients aged 37-75: Has the patient had a colonoscopy / FIT/ Cologuard? no    If the patient is female:    4. For patients aged 63-74: Has the patient had a mammogram within the past 2 years? No /due next month    5. For patients aged 21-65: Has the patient had a pap smear? yes    Health Maintenance: reviewed and discussed and ordered per Provider.    Health Maintenance Due   Topic Date Due    Hepatitis B vaccine (1 of 3 - 3-dose series) Never done    Varicella vaccine (1 of 2 - 2-dose childhood series) Never done    Cervical cancer screen  Never done    COVID-19 Vaccine (4 - 2023-24 season) 03/16/2022        - [name]/MA  [employer]  [facility]  Phone: [phone]  Fax: [phone]

## 2022-10-19 NOTE — Progress Notes (Signed)
History of Present Illness  Cassandra George is a 42 y.o. female who presents today for management of    Chief Complaint   Patient presents with    Follow-up         -pt overall stable since last visit  -reports some chest pain and tightness that occurs twice weekly  -pain usually occurs at rest, and is usually resolved after some deep breathing  -pt reports some increased stress  -denies lightheadedness, palpitations, SOB, or leg swelling  -pt has also noticed some right sided low back/hip pain, improved with movement    Patient Active Problem List   Diagnosis    Vitamin D deficiency    Mixed hyperlipidemia      History reviewed. No pertinent past medical history.   No past surgical history on file.   No family history on file.  Social History     Socioeconomic History    Marital status: Single     Spouse name: Not on file    Number of children: Not on file    Years of education: Not on file    Highest education level: Not on file   Occupational History    Not on file   Tobacco Use    Smoking status: Never    Smokeless tobacco: Never   Substance and Sexual Activity    Alcohol use: Yes    Drug use: Not Currently    Sexual activity: Not on file   Other Topics Concern    Not on file   Social History Narrative    Not on file     Social Determinants of Health     Financial Resource Strain: Low Risk  (07/20/2022)    Overall Financial Resource Strain (CARDIA)     Difficulty of Paying Living Expenses: Not very hard   Food Insecurity: No Food Insecurity (07/20/2022)    Hunger Vital Sign     Worried About Running Out of Food in the Last Year: Never true     Ran Out of Food in the Last Year: Never true   Transportation Needs: Unknown (07/20/2022)    PRAPARE - Therapist, art (Medical): Not on file     Lack of Transportation (Non-Medical): No   Physical Activity: Insufficiently Active (07/19/2022)    Exercise Vital Sign     Days of Exercise per Week: 2 days     Minutes of Exercise per Session: 60 min    Stress: Not on file   Social Connections: Not on file   Intimate Partner Violence: Not on file   Housing Stability: Unknown (07/20/2022)    Housing Stability Vital Sign     Unable to Pay for Housing in the Last Year: Not on file     Number of Places Lived in the Last Year: Not on file     Unstable Housing in the Last Year: No      Current Outpatient Medications   Medication Sig Dispense Refill    vitamin D (ERGOCALCIFEROL) 1.25 MG (50000 UT) CAPS capsule Take 1 capsule by mouth once a week 12 capsule 1     No current facility-administered medications for this visit.        ROS   Review of Systems   Constitutional:  Negative for chills and fever.   Respiratory:  Negative for shortness of breath.    Cardiovascular:  Positive for chest pain. Negative for palpitations.   Gastrointestinal:  Negative for abdominal pain.  Musculoskeletal:  Positive for back pain.   Neurological:  Negative for dizziness and light-headedness.   Psychiatric/Behavioral:  Negative for dysphoric mood.        Physical Exam  Vitals:    10/19/22 0935   BP: 110/74   Site: Left Upper Arm   Position: Sitting   Cuff Size: Large Adult   Pulse: 74   Resp: 14   Temp: 97.2 F (36.2 C)   TempSrc: Temporal   SpO2: 99%   Weight: 62.1 kg (137 lb)   Height: 1.6 m (5\' 3" )       General: alert, oriented, not in distress  Chest/Lungs: clear breath sounds, no wheezing or crackles  Heart: normal rate, regular rhythm, no murmur  Extremities: no focal deformities, no edema  Skin: no active skin lesions      Assessment/Plan:    Cassandra George was seen today for follow-up.    Diagnoses and all orders for this visit:    Chest pain, unspecified type  -intermittent episodes at rest which resolve with deep breathing, overall low concern for cardiac etiology  -will continue to monitor, pt given resources for deep breathing and mindfulness    Right hip pain  -mild, intermittent  -SI vs hip etiology  -pt given stretching/strengthening exercises    Vitamin D deficiency  -     Vitamin  D 25 Hydroxy; Future        On this date 10/19/2022 I have spent 20 minutes reviewing previous notes, test results and face to face with the patient discussing the diagnosis and importance of compliance with the treatment plan as well as documenting on the day of the visit.    I have discussed the diagnosis with the patient and the intended plan as seen in the above orders.  The patient has received an after-visit summary and questions were answered concerning future plans.  I have discussed medication side effects and warnings with the patient as well. I have reviewed the plan of care with the patient, accepted their input and they are in agreement with the treatment goals.     Follow-up and Dispositions    Return in about 3 months (around 01/18/2023) for chest tightness .       Memory ArgueMonique J Samual Beals, MD   October 19, 2022

## 2023-01-18 ENCOUNTER — Ambulatory Visit
Admit: 2023-01-18 | Discharge: 2023-01-18 | Payer: PRIVATE HEALTH INSURANCE | Attending: Student in an Organized Health Care Education/Training Program | Primary: Student in an Organized Health Care Education/Training Program

## 2023-01-18 DIAGNOSIS — G44209 Tension-type headache, unspecified, not intractable: Secondary | ICD-10-CM

## 2023-01-18 MED ORDER — AMITRIPTYLINE HCL 10 MG PO TABS
10 MG | ORAL_TABLET | Freq: Every evening | ORAL | 3 refills | Status: DC
Start: 2023-01-18 — End: 2023-05-27

## 2023-01-18 MED ORDER — ASPIRIN-ACETAMINOPHEN-CAFFEINE 250-250-65 MG PO TABS
250-250-65 MG | ORAL_TABLET | Freq: Four times a day (QID) | ORAL | 3 refills | Status: DC | PRN
Start: 2023-01-18 — End: 2023-10-09

## 2023-01-18 NOTE — Progress Notes (Signed)
History of Present Illness  Cassandra George is a 42 y.o. female who presents today for management of    Chief Complaint   Patient presents with    Follow-up    Medication Refill       -pt overall stable since last visit  -her previous symptoms of intermittent chest tightness have resolved  -sheh as noticed HA for past 2 weeks on and off  -feels pain at top of her head and entire posterior scalp  -OTC pain medication does help, however pain keeps coming back after doses wears off  -denies nausea, vomiting, photophobia or phonophobia        Patient Active Problem List   Diagnosis    Vitamin D deficiency    Mixed hyperlipidemia      History reviewed. No pertinent past medical history.   History reviewed. No pertinent surgical history.   History reviewed. No pertinent family history.  Social History     Socioeconomic History    Marital status: Single     Spouse name: Not on file    Number of children: Not on file    Years of education: Not on file    Highest education level: Not on file   Occupational History    Not on file   Tobacco Use    Smoking status: Never    Smokeless tobacco: Never   Substance and Sexual Activity    Alcohol use: Yes    Drug use: Not Currently    Sexual activity: Not on file   Other Topics Concern    Not on file   Social History Narrative    Not on file     Social Determinants of Health     Financial Resource Strain: Low Risk  (01/18/2023)    Overall Financial Resource Strain (CARDIA)     Difficulty of Paying Living Expenses: Not hard at all   Food Insecurity: No Food Insecurity (01/18/2023)    Hunger Vital Sign     Worried About Running Out of Food in the Last Year: Never true     Ran Out of Food in the Last Year: Never true   Transportation Needs: Unknown (01/18/2023)    PRAPARE - Therapist, art (Medical): Not on file     Lack of Transportation (Non-Medical): No   Physical Activity: Insufficiently Active (07/19/2022)    Exercise Vital Sign     Days of Exercise per Week: 2  days     Minutes of Exercise per Session: 60 min   Stress: Not on file   Social Connections: Not on file   Intimate Partner Violence: Not on file   Housing Stability: Unknown (01/18/2023)    Housing Stability Vital Sign     Unable to Pay for Housing in the Last Year: Not on file     Number of Places Lived in the Last Year: Not on file     Unstable Housing in the Last Year: No      Current Outpatient Medications   Medication Sig Dispense Refill    vitamin D (ERGOCALCIFEROL) 1.25 MG (50000 UT) CAPS capsule Take 1 capsule by mouth once a week 12 capsule 1     No current facility-administered medications for this visit.        ROS   Review of Systems   Constitutional:  Negative for chills and fever.   Respiratory:  Negative for shortness of breath.    Cardiovascular:  Negative for chest  pain.   Gastrointestinal:  Negative for abdominal pain and nausea.   Neurological:  Positive for headaches. Negative for dizziness and light-headedness.   Psychiatric/Behavioral:  Negative for dysphoric mood.          Physical Exam  Vitals:    01/18/23 0954   BP: 122/80   Site: Left Upper Arm   Position: Sitting   Cuff Size: Medium Adult   Pulse: 71   Resp: 18   Temp: 97.9 F (36.6 C)   TempSrc: Temporal   SpO2: 99%   Weight: 62.1 kg (137 lb)   Height: 1.6 m (5\' 3" )     General: alert, oriented, not in distress  Chest/Lungs: breathing comfortably on room air  Heart: normal rate  Extremities: no focal deformities, no edema  Skin: no active skin lesions      Assessment/Plan:    [name] was seen today for follow-up and medication refill.    Diagnoses and all orders for this visit:    Tension headache  -daily HA for several weeks  -trial of nightly elavil  -Excedrin for breakthrough pain  -     aspirin-acetaminophen-caffeine (EXCEDRIN MIGRAINE) 250-250-65 MG per tablet; Take 1 tablet by mouth every 6 hours as needed for Headaches  -     amitriptyline (ELAVIL) 10 MG tablet; Take 1 tablet by mouth nightly          On this date 01/18/2023 I have  spent 20 minutes reviewing previous notes, test results and face to face with the patient discussing the diagnosis and importance of compliance with the treatment plan as well as documenting on the day of the visit.    I have discussed the diagnosis with the patient and the intended plan as seen in the above orders.  The patient has received an after-visit summary and questions were answered concerning future plans.  I have discussed medication side effects and warnings with the patient as well. I have reviewed the plan of care with the patient, accepted their input and they are in agreement with the treatment goals.     Follow-up and Dispositions    Return in about 4 weeks (around 02/15/2023).       Memory Argue, MD   January 18, 2023

## 2023-01-18 NOTE — Progress Notes (Signed)
Cassandra George is a 42 y.o. presents today for No chief complaint on file.    Is someone accompanying this pt? no    Is the patient using any DME equipment during OV? no  There were no vitals filed for this visit.    Depression Screening:       01/18/2023     9:42 AM 10/19/2022     9:35 AM 07/20/2022     3:54 PM   PHQ-9 Questionaire   Little interest or pleasure in doing things 0 0 0   Feeling down, depressed, or hopeless 0 0 0   PHQ-9 Total Score 0 0 0        Abuse Screening:       No data to display                 Learning Assessment Screening:   No question data found.     Fall Risk Screening:        No data to display                    Health Maintenance: reviewed and discussed and ordered per Provider.    Health Maintenance Due   Topic Date Due    Hepatitis B vaccine (1 of 3 - 3-dose series) Never done    Varicella vaccine (1 of 2 - 2-dose childhood series) Never done    COVID-19 Vaccine (4 - 2023-24 season) 03/16/2022         Coordination of Care:   1. "Have you been to the ER, urgent care clinic since your last visit?  Hospitalized since your last visit?" no    2. "Have you seen or consulted any other health care providers outside of the [facility] since your last visit?" no    3. For patients aged 25-75: Has the patient had a colonoscopy / FIT/ Cologuard?NA - based on age or sex  If the patient is female:    4. For patients aged 15-74: Has the patient had a mammogram within the past 2 years? Yes - no Care Gap present    5. For patients aged 21-65: Has the patient had a pap smear? Yes - no Care Gap present    Advanced Directive:  1. Do you have an Advanced Directive? No     2. Would you like information on Advanced Directives? No

## 2023-01-24 MED ORDER — VITAMIN D (ERGOCALCIFEROL) 1.25 MG (50000 UT) PO CAPS
1.25 MG (50000 UT) | ORAL_CAPSULE | ORAL | 1 refills | Status: DC
Start: 2023-01-24 — End: 2023-07-14

## 2023-01-24 NOTE — Telephone Encounter (Signed)
Medication(s) requesting:   Requested Prescriptions     Pending Prescriptions Disp Refills    vitamin D (ERGOCALCIFEROL) 1.25 MG (50000 UT) CAPS capsule 12 capsule 1     Sig: Take 1 capsule by mouth once a week       Last office visit:  01/18/23  Next office visit DMA: 02/15/2023

## 2023-02-15 ENCOUNTER — Encounter
Payer: PRIVATE HEALTH INSURANCE | Attending: Student in an Organized Health Care Education/Training Program | Primary: Student in an Organized Health Care Education/Training Program

## 2023-04-25 ENCOUNTER — Ambulatory Visit
Admit: 2023-04-25 | Discharge: 2023-04-25 | Payer: PRIVATE HEALTH INSURANCE | Attending: Student in an Organized Health Care Education/Training Program | Primary: Student in an Organized Health Care Education/Training Program

## 2023-04-25 ENCOUNTER — Inpatient Hospital Stay
Admit: 2023-04-25 | Payer: PRIVATE HEALTH INSURANCE | Primary: Student in an Organized Health Care Education/Training Program

## 2023-04-25 DIAGNOSIS — G44209 Tension-type headache, unspecified, not intractable: Secondary | ICD-10-CM

## 2023-04-25 LAB — SENTARA SPECIMEN COLLECTION

## 2023-04-25 NOTE — Progress Notes (Signed)
Cassandra George is a 42 y.o. year old female who presents today for   Chief Complaint   Patient presents with    Rash        "Have you been to the ER, urgent care clinic since your last visit?  Hospitalized since your last visit?"   NO     "Have you seen or consulted any other health care providers outside our system since your last visit?"   NO             - Jaceyon Strole/MA  [employer]  [facility]  Phone: [phone]  Fax: [phone]

## 2023-04-25 NOTE — Progress Notes (Signed)
History of Present Illness  Cassandra George is a 42 y.o. female who presents today for management of    Chief Complaint   Patient presents with    Rash         -pt overall stable since last visit  -nightly elavil is helping her HA, which have resolved with daily use  -seen by [facility] 2 months ago for rash of her face  -prescibed doxy for 6 weeks and tacrolimus oitment  -tacrolimus feels like it is burning her face   -pt requesting allergy testing    Patient Active Problem List   Diagnosis    Vitamin D deficiency    Mixed hyperlipidemia      History reviewed. No pertinent past medical history.   No past surgical history on file.   No family history on file.  Social History     Socioeconomic History    Marital status: Single     Spouse name: Not on file    Number of children: Not on file    Years of education: Not on file    Highest education level: Not on file   Occupational History    Not on file   Tobacco Use    Smoking status: Never    Smokeless tobacco: Never   Substance and Sexual Activity    Alcohol use: Yes    Drug use: Not Currently    Sexual activity: Not on file   Other Topics Concern    Not on file   Social History Narrative    Not on file     Social Determinants of Health     Financial Resource Strain: Low Risk  (04/25/2023)    Overall Financial Resource Strain (CARDIA)     Difficulty of Paying Living Expenses: Not hard at all   Food Insecurity: No Food Insecurity (04/25/2023)    Hunger Vital Sign     Worried About Running Out of Food in the Last Year: Never true     Ran Out of Food in the Last Year: Never true   Transportation Needs: Unknown (04/25/2023)    PRAPARE - Therapist, art (Medical): Not on file     Lack of Transportation (Non-Medical): No   Physical Activity: Insufficiently Active (07/19/2022)    Exercise Vital Sign     Days of Exercise per Week: 2 days     Minutes of Exercise per Session: 60 min   Stress: Not on file   Social Connections: Not on file    Intimate Partner Violence: Not on file   Housing Stability: Unknown (04/25/2023)    Housing Stability Vital Sign     Unable to Pay for Housing in the Last Year: Not on file     Number of Times Moved in the Last Year: Not on file     Homeless in the Last Year: No      Current Outpatient Medications   Medication Sig Dispense Refill    vitamin D (ERGOCALCIFEROL) 1.25 MG (50000 UT) CAPS capsule Take 1 capsule by mouth once a week 12 capsule 1    aspirin-acetaminophen-caffeine (EXCEDRIN MIGRAINE) 250-250-65 MG per tablet Take 1 tablet by mouth every 6 hours as needed for Headaches 90 tablet 3    amitriptyline (ELAVIL) 10 MG tablet Take 1 tablet by mouth nightly 30 tablet 3     No current facility-administered medications for this visit.        ROS   Review of  Systems      Physical Exam  Vitals:    04/25/23 0843   BP: 111/73   Site: Left Upper Arm   Position: Sitting   Cuff Size: Large Adult   Pulse: 74   Resp: 13   Temp: 97.8 F (36.6 C)   TempSrc: Temporal   SpO2: 99%   Weight: 62.2 kg (137 lb 3.2 oz)   Height: 1.6 m (5\' 3" )     General: alert, oriented, not in distress  Chest/Lungs: breathing comfortably on room air  Heart: normal rate  Extremities: no focal deformities, no edema  Skin: no active skin lesions      Assessment/Plan:    Cassandra "Wilberta Rickel" was seen today for rash.    Diagnoses and all orders for this visit:    Tension headache  -improved  -continue elavil nightly    Rash of face  -pt encouraged to schedule follow up with Derm  -     External Referral To Allergy  -     Allergen Panel, Food, Basic; Future  -     Allergen Profile, Zone 2; Future          On this date 04/25/2023 I have spent 20 minutes reviewing previous notes, test results and face to face with the patient discussing the diagnosis and importance of compliance with the treatment plan as well as documenting on the day of the visit.    I have discussed the diagnosis with the patient and the intended plan as seen in the above orders.   The patient has received an after-visit summary and questions were answered concerning future plans.  I have discussed medication side effects and warnings with the patient as well. I have reviewed the plan of care with the patient, accepted their input and they are in agreement with the treatment goals.     Follow-up and Dispositions    Return in about 3 months (around 07/26/2023).       Memory Argue, MD   April 25, 2023

## 2023-04-30 LAB — ALLERGEN PROFILE, ZONE 2
ALLERGEN COCKROACH: <0.1 kU/L (ref <0.10)
Allergen Birch IgE: <0.1 kU/L (ref <0.10)
Allergen Mountain Cedar: <0.1 kU/L (ref <0.10)
Allergen, Pigweed IgE: <0.1 kU/L (ref <0.10)
Alternaria Alternata: <0.1 kU/L (ref <0.10)
Aspergillus Fumigatus Ab: <0.1 kU/L (ref <0.10)
Bermuda Grass IgE: <0.1 kU/L (ref <0.10)
Box Elder IgE: <0.1 kU/L (ref <0.10)
Cat Dander IgE: <0.1 kU/L (ref <0.10)
Cladosporium Herbarum: <0.1 kU/L (ref <0.10)
Common-Short Ragweed IgE: <0.1 kU/L (ref <0.10)
Cottonwood IgE: <0.1 kU/L (ref <0.10)
D. farinae IgE: <0.1 kU/L (ref <0.10)
Dog Dander IgE: <0.1 kU/L (ref <0.10)
Elm IgE: <0.1 kU/L (ref <0.10)
IgE: 10 kU/L (ref 0–114)
Johnson Grass IgE: <0.1 kU/L (ref <0.10)
Mite Dust Pteronyssinus IgE: <0.1 kU/L (ref <0.10)
Mouse Urine Proteins (E72) IgE: <0.1 kU/L (ref <0.10)
Mulberry, IgE: <0.1 kU/L (ref <0.10)
Oak, IgE: <0.1 kU/L (ref <0.10)
Pecan: <0.1 kU/L (ref <0.10)
Penicillium Notatum: <0.1 kU/L (ref <0.10)
Sheep Sorrel, IgE: <0.1 kU/L (ref <0.10)
Timothy Grass, IgE: <0.1 kU/L (ref <0.10)

## 2023-04-30 LAB — ALLERGEN PANEL, FOOD, BASIC
Allergen Codfish: <0.1 kU/L (ref <0.10)
Allergen, Peanut IgE: <0.1 kU/L (ref <0.10)
Clams IgE: <0.1 kU/L (ref <0.10)
Corn IgE: <0.1 kU/L (ref <0.10)
Egg White IgE: <0.1 kU/L (ref <0.10)
IgE: 11 kU/L (ref 0–114)
Milk IgE: <0.1 kU/L (ref <0.10)
SOY IGE CLASS: <0.1 kU/L (ref <0.10)
Scallop IgE: <0.1 kU/L (ref <0.10)
Sesame Seed IgE: <0.1 kU/L (ref <0.10)
Shrimp IgE: <0.1 kU/L (ref <0.10)
Walnut IgE: <0.1 kU/L (ref <0.10)
Wheat IgE: <0.1 kU/L (ref <0.10)

## 2023-05-13 NOTE — Telephone Encounter (Signed)
Pt requested an appt via MyChart. Reached out to the pt to schedule appt w/ PCP, pt stated she went to the ER and does not need an appt @ this time

## 2023-05-27 ENCOUNTER — Encounter

## 2023-05-27 MED ORDER — AMITRIPTYLINE HCL 10 MG PO TABS
10 MG | ORAL_TABLET | Freq: Every evening | ORAL | 3 refills | Status: DC
Start: 2023-05-27 — End: 2023-10-09

## 2023-05-27 NOTE — Telephone Encounter (Signed)
Medication(s) requesting:   Requested Prescriptions     Pending Prescriptions Disp Refills    amitriptyline (ELAVIL) 10 MG tablet [Pharmacy Med Name: AMITRIPTYLINE 10MG  TABLETS] 30 tablet 3     Sig: TAKE 1 TABLET BY MOUTH EVERY NIGHT       Last office visit:  04/25/2023  Next office visit DMA: 07/26/2023

## 2023-07-11 NOTE — Telephone Encounter (Signed)
 Medication(s) requesting:   Requested Prescriptions     Pending Prescriptions Disp Refills    vitamin D (ERGOCALCIFEROL) 1.25 MG (50000 UT) CAPS capsule [Pharmacy Med Name: VITAMIN D2 50,000IU (ERGO) CAP RX] 12 capsule 1     Sig: TAKE 1 CAPSULE BY MOUTH 1

## 2023-07-14 MED ORDER — VITAMIN D (ERGOCALCIFEROL) 1.25 MG (50000 UT) PO CAPS
1.25 MG (50000 UT) | ORAL_CAPSULE | ORAL | 1 refills | Status: DC
Start: 2023-07-14 — End: 2023-12-27

## 2023-08-23 ENCOUNTER — Encounter
Payer: PRIVATE HEALTH INSURANCE | Attending: Student in an Organized Health Care Education/Training Program | Primary: Student in an Organized Health Care Education/Training Program

## 2023-10-09 ENCOUNTER — Inpatient Hospital Stay
Admit: 2023-10-11 | Payer: PRIVATE HEALTH INSURANCE | Primary: Student in an Organized Health Care Education/Training Program

## 2023-10-09 ENCOUNTER — Ambulatory Visit
Admit: 2023-10-09 | Discharge: 2023-10-09 | Payer: PRIVATE HEALTH INSURANCE | Attending: Student in an Organized Health Care Education/Training Program | Primary: Student in an Organized Health Care Education/Training Program

## 2023-10-09 VITALS — BP 107/69 | HR 86 | Temp 97.60000°F | Resp 13 | Ht 63.0 in | Wt 133.6 lb

## 2023-10-09 DIAGNOSIS — Z0000 Encounter for general adult medical examination without abnormal findings: Secondary | ICD-10-CM

## 2023-10-09 MED ORDER — ASPIRIN-ACETAMINOPHEN-CAFFEINE 250-250-65 MG PO TABS
250-250-65 | ORAL_TABLET | Freq: Two times a day (BID) | ORAL | 2 refills | Status: AC | PRN
Start: 2023-10-09 — End: ?

## 2023-10-09 NOTE — Progress Notes (Signed)
 Cassandra George is a 44 y.o. year old female who presents today for   Chief Complaint   Patient presents with    Annual Exam        "Have you been to the ER, urgent care clinic since your last visit?  Hospitalized since your last visit?"   YES - When: approximately 6 months ago.  Where and Why: Ruptured ovarian cyst and went to urgent care last Sunday for headache.     "Have you seen or consulted any other health care providers outside our system since your last visit?"   NO             - [name]/MA  [facility]  Phone: [phone]  Fax: [phone]

## 2023-10-09 NOTE — Progress Notes (Signed)
 History of Present Illness  Cassandra George is a 43 y.o. female who presents today for management of    Chief Complaint   Patient presents with    Annual Exam       - pt overall stable since last visit, here for annual physical  -UTD on mammogram, due for repeat in May  -pt requesting pap smear and pelvic exam to be done this year as well (last done 2023)  -she would like to also discuss recent increased stressors, mom has been sick  -she has been having return of headaches and pressure sensation in her head  -previously prescribed elavil however she does not want to take this because it is an antidepressant   -found benefit to Excedrin in the past   -pt has also been noticing frothy urine that she has been concerned about    Patient Active Problem List   Diagnosis    Vitamin D deficiency    Mixed hyperlipidemia      History reviewed. No pertinent past medical history.   History reviewed. No pertinent surgical history.   History reviewed. No pertinent family history.  Social History     Socioeconomic History    Marital status: Single     Spouse name: Not on file    Number of children: Not on file    Years of education: Not on file    Highest education level: Not on file   Occupational History    Not on file   Tobacco Use    Smoking status: Never    Smokeless tobacco: Never   Substance and Sexual Activity    Alcohol use: Yes     Alcohol/week: 1.0 standard drink of alcohol     Types: 1 Drinks containing 0.5 oz of alcohol per week    Drug use: Never    Sexual activity: Yes     Partners: Male   Other Topics Concern    Not on file   Social History Narrative    Not on file     Social Drivers of Health     Financial Resource Strain: Low Risk  (04/25/2023)    Overall Financial Resource Strain (CARDIA)     Difficulty of Paying Living Expenses: Not hard at all   Food Insecurity: No Food Insecurity (10/09/2023)    Hunger Vital Sign     Worried About Running Out of Food in the Last Year: Never true     Ran Out of Food in the  Last Year: Never true   Transportation Needs: No Transportation Needs (10/09/2023)    PRAPARE - Therapist, art (Medical): No     Lack of Transportation (Non-Medical): No   Physical Activity: Insufficiently Active (07/19/2022)    Exercise Vital Sign     Days of Exercise per Week: 2 days     Minutes of Exercise per Session: 60 min   Stress: Not on file   Social Connections: Not on file   Intimate Partner Violence: Not on file   Housing Stability: Low Risk  (10/09/2023)    Housing Stability Vital Sign     Unable to Pay for Housing in the Last Year: No     Number of Times Moved in the Last Year: 0     Homeless in the Last Year: No      Current Outpatient Medications   Medication Sig Dispense Refill    vitamin D (ERGOCALCIFEROL) 1.25 MG (50000 UT) CAPS capsule  TAKE 1 CAPSULE BY MOUTH 1 TIME A WEEK 12 capsule 1    amitriptyline (ELAVIL) 10 MG tablet TAKE 1 TABLET BY MOUTH EVERY NIGHT 30 tablet 3    aspirin-acetaminophen-caffeine (EXCEDRIN MIGRAINE) 250-250-65 MG per tablet Take 1 tablet by mouth every 6 hours as needed for Headaches 90 tablet 3     No current facility-administered medications for this visit.        ROS   Review of Systems   Constitutional:  Negative for chills and fever.   Respiratory:  Negative for shortness of breath.    Cardiovascular:  Negative for chest pain.   Gastrointestinal:  Negative for abdominal pain.   Genitourinary:  Negative for dysuria.   Neurological:  Positive for headaches. Negative for dizziness and light-headedness.   Psychiatric/Behavioral:  Negative for dysphoric mood. The patient is not nervous/anxious.          Physical Exam  Vitals:    10/09/23 0759   BP: 107/69   BP Site: Left Upper Arm   Patient Position: Sitting   BP Cuff Size: Large Adult   Pulse: 86   Resp: 13   Temp: 97.6 F (36.4 C)   TempSrc: Temporal   SpO2: 98%   Weight: 60.6 kg (133 lb 9.6 oz)   Height: 1.6 m (5\' 3" )     General: alert, oriented, not in distress  Chest/Lungs: clear breath  sounds, no wheezing or crackles  Heart: normal rate, regular rhythm, no murmur  Extremities: no focal deformities, no edema  Skin: no active skin lesions      Assessment/Plan:    Cassandra "Zahriyah Spangle" was seen today for annual exam.    Diagnoses and all orders for this visit:    Annual physical exam  -UTD on age appropriate cancer screening   -discussed importance of healthy eating and exercise for overall health benefit   -     Comprehensive Metabolic Panel; Future  -     CBC with Auto Differential; Future    Mixed hyperlipidemia  -discussed importance of heart healthy eating and exercise   -     Lipid Panel; Future    Stress reaction  -pt denies overt depression, declines referral to therapist  -continue to monitor    Tension headache  -trial of excedrin as needed   -     aspirin-acetaminophen-caffeine (EXCEDRIN MIGRAINE) 250-250-65 MG per tablet; Take 1 tablet by mouth every 12 hours as needed for Headaches    Diabetes mellitus screening  -     Hemoglobin A1C; Future    Fatigue, unspecified type  -     TSH; Future  -     Vitamin B12; Future    Vitamin D deficiency  -     Vitamin D 25 Hydroxy; Future    Frothy urine  -     Urinalysis; Future    Immunity status testing  -     Varicella Zoster Antibody, IgG; Future  -     Hepatitis B Surface Antibody; Future    Encounter for screening mammogram for malignant neoplasm of breast  -     MAM TOMO DIGITAL SCREEN BILATERAL; Future          On this date 10/09/2023 I have spent 20 minutes reviewing previous notes, test results and face to face with the patient discussing the diagnosis and importance of compliance with the treatment plan as well as documenting on the day of the visit.    I have discussed the  diagnosis with the patient and the intended plan as seen in the above orders.  The patient has received an after-visit summary and questions were answered concerning future plans.  I have discussed medication side effects and warnings with the patient as well. I have  reviewed the plan of care with the patient, accepted their input and they are in agreement with the treatment goals.     Follow-up and Dispositions    Return in about 4 weeks (around 11/06/2023) for Pap smear .       Memory Argue, MD   October 09, 2023

## 2023-10-10 LAB — URINALYSIS
Bilirubin, Urine: NEGATIVE
Glucose, Ur: NEGATIVE mg/dL
Leukocyte Esterase, Urine: NEGATIVE
Nitrite, Urine: NEGATIVE
Occult Blood,Urine: NEGATIVE
Protein, Urine: NEGATIVE mg/dL
Specific Gravity, UA: 1.016 (ref 1.005–1.030)
Urobilinogen, Urine: 0.2 mg/dL (ref <2.0)
pH, Urine: 6 pH (ref 5.0–8.0)

## 2023-10-10 LAB — CBC WITH AUTO DIFFERENTIAL
Basophils Absolute: 0 10*3/uL (ref 0.0–0.2)
Basophils: 1 % (ref 0–2)
Eosinophils Absolute: 0 10*3/uL (ref 0.0–0.5)
Eosinophils: 1 % (ref 0–6)
Hematocrit: 45 % (ref 35.1–46.5)
Hemoglobin: 13.9 g/dL (ref 11.7–15.5)
Lymphocytes Absolute: 1.2 10*3/uL (ref 1.0–4.8)
Lymphocytes: 33 % (ref 20–45)
MCH: 29 pg (ref 26–34)
MCHC: 31 g/dL (ref 31–36)
MCV: 93 fL (ref 80–99)
MPV: 10.3 fL (ref 9.0–13.0)
Monocytes Absolute: 0.3 10*3/uL (ref 0.1–1.0)
Monocytes: 8 % (ref 3–12)
Neutrophils Absolute: 2.1 10*3/uL (ref 1.8–7.7)
Neutrophils Segmented: 58 % (ref 40–75)
Platelets: 297 10*3/uL (ref 140–440)
RBC: 4.85 M/uL (ref 3.80–5.20)
RDW: 13.2 % (ref 10.0–15.5)
WBC: 3.6 10*3/uL — ABNORMAL LOW (ref 4.0–11.0)

## 2023-10-10 LAB — VITAMIN B12: Vitamin B-12: 554 pg/mL (ref 211–911)

## 2023-10-10 LAB — HEMOGLOBIN A1C
Estimated Avg Glucose, External: 108 mg/dL (ref 91–123)
Estimated Avg Glucose: 108 mg/dL (ref 91–123)
Hemoglobin A1C, External: 5.4 % (ref 4.8–5.6)
Hemoglobin A1C: 5.4 % (ref 4.8–5.6)

## 2023-10-10 LAB — VITAMIN D 25 HYDROXY: Vit D, 25-Hydroxy: 63.8 ng/mL (ref 32.0–100.0)

## 2023-10-10 LAB — TSH: TSH: 1.55 uU/mL (ref 0.27–4.20)

## 2023-10-11 LAB — LIPID PANEL
Chol/HDL Ratio: 5.5 (ref 0.0–5.0)
Cholesterol, Total: 252 mg/dL — ABNORMAL HIGH (ref 110–200)
HDL: 46 mg/dL (ref >=40)
LDL Cholesterol: 197 mg/dL — ABNORMAL HIGH (ref 50–99)
LDL/HDL Ratio: 4.3
Non-HDL Cholesterol: 206 mg/dL — ABNORMAL HIGH (ref <130)
Triglycerides: 42 mg/dL (ref 40–149)
VLDL Cholesterol Calculated: 8 mg/dL (ref 8–30)

## 2023-10-11 LAB — COMPREHENSIVE METABOLIC PANEL
ALT: 21 U/L (ref 5–40)
AST: 23 U/L (ref 10–37)
Albumin/Globulin Ratio: 1.9 ratio (ref 1.1–2.6)
Albumin: 4.8 g/dL (ref 3.5–5.0)
Alkaline Phosphatase: 98 U/L (ref 25–115)
Anion Gap: 14 mmol/L (ref 3.0–15.0)
BUN: 11 mg/dL (ref 6–22)
CO2: 18 mmol/L — ABNORMAL LOW (ref 20–32)
Calcium: 9.4 mg/dL (ref 8.4–10.5)
Chloride: 106 mmol/L (ref 98–110)
Creatinine: 0.7 mg/dL (ref 0.5–1.2)
Est, Glom Filt Rate: >60 mL/min/{1.73_m2} (ref >60.0)
Globulin: 2.5 g/dL (ref 2.0–4.0)
Glucose: 88 mg/dL (ref 70–99)
Potassium: 4.4 mmol/L (ref 3.5–5.5)
Sodium: 138 mmol/L (ref 133–145)
Total Bilirubin: 0.5 mg/dL (ref 0.2–1.2)
Total Protein: 7.3 g/dL (ref 6.4–8.3)

## 2023-10-11 LAB — SENTARA SPECIMEN COLLECTION

## 2023-10-11 LAB — HEPATITIS B SURFACE ANTIBODY: Hep B S Ab: <3.5 IU/L (ref >=10.000)

## 2023-10-11 LAB — VARICELLA ZOSTER ANTIBODY, IGG: Varicella Zoster Ab IgG: 4.3 AI (ref >=1.1)

## 2023-11-14 ENCOUNTER — Encounter
Payer: PRIVATE HEALTH INSURANCE | Attending: Student in an Organized Health Care Education/Training Program | Primary: Student in an Organized Health Care Education/Training Program

## 2023-11-18 ENCOUNTER — Ambulatory Visit
Admit: 2023-11-18 | Discharge: 2023-11-18 | Payer: PRIVATE HEALTH INSURANCE | Attending: Student in an Organized Health Care Education/Training Program | Primary: Student in an Organized Health Care Education/Training Program

## 2023-11-18 VITALS — BP 104/68 | HR 87 | Temp 97.80000°F | Resp 13 | Ht 63.0 in | Wt 133.4 lb

## 2023-11-18 DIAGNOSIS — E782 Mixed hyperlipidemia: Secondary | ICD-10-CM

## 2023-11-18 MED ORDER — ROSUVASTATIN CALCIUM 10 MG PO TABS
10 | ORAL_TABLET | Freq: Every day | ORAL | 2 refills | 90.00000 days | Status: DC
Start: 2023-11-18 — End: 2024-08-19

## 2023-11-18 NOTE — Progress Notes (Signed)
 Cassandra George is a 43 y.o. year old female who presents today for   Chief Complaint   Patient presents with    Gynecologic Exam        "Have you been to the ER, urgent care clinic since your last visit?  Hospitalized since your last visit?"   NO     "Have you seen or consulted any other health care providers outside our system since your last visit?"   NO             - Makaiyah Schweiger/MA  [employer]  Phone: [phone]  Fax: [phone]

## 2023-11-18 NOTE — Progress Notes (Signed)
 History of Present Illness  Cassandra George is a 43 y.o. female who presents today for management of    Chief Complaint   Patient presents with    Gynecologic Exam         -pt scheduled for pap smear, however needs this rescheduled as she is currently on her cycle  -would like to discuss elevated cholesterol, LDL>190  -headaches have improved    Patient Active Problem List   Diagnosis    Vitamin D  deficiency    Mixed hyperlipidemia      History reviewed. No pertinent past medical history.   No past surgical history on file.   No family history on file.  Social History     Socioeconomic History    Marital status: Single     Spouse name: Not on file    Number of children: Not on file    Years of education: Not on file    Highest education level: Not on file   Occupational History    Not on file   Tobacco Use    Smoking status: Never    Smokeless tobacco: Never   Substance and Sexual Activity    Alcohol use: Yes     Alcohol/week: 1.0 standard drink of alcohol     Types: 1 Drinks containing 0.5 oz of alcohol per week    Drug use: Never    Sexual activity: Yes     Partners: Male   Other Topics Concern    Not on file   Social History Narrative    Not on file     Social Drivers of Health     Financial Resource Strain: Low Risk  (04/25/2023)    Overall Financial Resource Strain (CARDIA)     Difficulty of Paying Living Expenses: Not hard at all   Food Insecurity: No Food Insecurity (11/18/2023)    Hunger Vital Sign     Worried About Running Out of Food in the Last Year: Never true     Ran Out of Food in the Last Year: Never true   Transportation Needs: No Transportation Needs (11/18/2023)    PRAPARE - Therapist, art (Medical): No     Lack of Transportation (Non-Medical): No   Physical Activity: Insufficiently Active (07/19/2022)    Exercise Vital Sign     Days of Exercise per Week: 2 days     Minutes of Exercise per Session: 60 min   Stress: Not on file   Social Connections: Not on file   Intimate  Partner Violence: Not on file   Housing Stability: Low Risk  (11/18/2023)    Housing Stability Vital Sign     Unable to Pay for Housing in the Last Year: No     Number of Times Moved in the Last Year: 0     Homeless in the Last Year: No      Current Outpatient Medications   Medication Sig Dispense Refill    aspirin -acetaminophen -caffeine  (EXCEDRIN  MIGRAINE) 250-250-65 MG per tablet Take 1 tablet by mouth every 12 hours as needed for Headaches 60 tablet 2    vitamin D  (ERGOCALCIFEROL ) 1.25 MG (50000 UT) CAPS capsule TAKE 1 CAPSULE BY MOUTH 1 TIME A WEEK 12 capsule 1     No current facility-administered medications for this visit.        ROS   Review of Systems   Constitutional:  Negative for chills and fever.   Respiratory:  Negative for shortness of  breath.    Cardiovascular:  Negative for chest pain.   Gastrointestinal:  Negative for abdominal pain.   Neurological:  Negative for dizziness, light-headedness and headaches.         Physical Exam  Vitals:    11/18/23 0805   BP: 104/68   BP Site: Left Upper Arm   Patient Position: Sitting   BP Cuff Size: Large Adult   Pulse: 87   Resp: 13   Temp: 97.8 F (36.6 C)   TempSrc: Temporal   SpO2: 98%   Weight: 60.5 kg (133 lb 6.6 oz)   Height: 1.6 m (5\' 3" )     General: alert, oriented, not in distress  Chest/Lungs: breathing comfortably on room air  Heart: normal rate  Extremities: no focal deformities, no edema  Skin: no active skin lesions      Assessment/Plan:    Cassandra George was seen today for gynecologic exam.    Diagnoses and all orders for this visit:    Mixed hyperlipidemia  -discussed importance of continue heart healthy eating and exercise  -start crestor   -     rosuvastatin (CRESTOR) 10 MG tablet; Take 1 tablet by mouth daily    Tension headache  -improved, CTM      On this date 11/18/2023 I have spent 20 minutes reviewing previous notes, test results and face to face with the patient discussing the diagnosis and importance of compliance with the treatment plan as well as  documenting on the day of the visit.    I have discussed the diagnosis with the patient and the intended plan as seen in the above orders.  The patient has received an after-visit summary and questions were answered concerning future plans.  I have discussed medication side effects and warnings with the patient as well. I have reviewed the plan of care with the patient, accepted their input and they are in agreement with the treatment goals.     Follow-up and Dispositions    Return in about 6 weeks (around 12/30/2023) for Pap.       Cassandra Fava, MD   Nov 18, 2023

## 2023-12-16 ENCOUNTER — Inpatient Hospital Stay
Admit: 2023-12-16 | Payer: PRIVATE HEALTH INSURANCE | Primary: Student in an Organized Health Care Education/Training Program

## 2023-12-16 DIAGNOSIS — Z1231 Encounter for screening mammogram for malignant neoplasm of breast: Secondary | ICD-10-CM

## 2023-12-27 MED ORDER — VITAMIN D (ERGOCALCIFEROL) 1.25 MG (50000 UT) PO CAPS
1.25 | ORAL_CAPSULE | ORAL | 1 refills | 83.00000 days | Status: DC
Start: 2023-12-27 — End: 2024-06-15

## 2024-01-02 ENCOUNTER — Ambulatory Visit
Admit: 2024-01-02 | Discharge: 2024-01-02 | Payer: PRIVATE HEALTH INSURANCE | Attending: Student in an Organized Health Care Education/Training Program | Primary: Student in an Organized Health Care Education/Training Program

## 2024-01-02 VITALS — BP 105/67 | HR 77 | Temp 97.30000°F | Resp 15 | Ht 63.0 in | Wt 132.0 lb

## 2024-01-02 DIAGNOSIS — Z01419 Encounter for gynecological examination (general) (routine) without abnormal findings: Secondary | ICD-10-CM

## 2024-01-02 NOTE — Progress Notes (Signed)
 History of Present Illness  Cassandra George is a 43 y.o. female who presents today for management of    Chief Complaint   Patient presents with    Gynecologic Exam         - Patient overall well since last visit  - Here for Pap smear      Patient Active Problem List   Diagnosis    Vitamin D  deficiency    Mixed hyperlipidemia      History reviewed. No pertinent past medical history.   No past surgical history on file.   No family history on file.  Social History     Socioeconomic History    Marital status: Single     Spouse name: Not on file    Number of children: Not on file    Years of education: Not on file    Highest education level: Not on file   Occupational History    Not on file   Tobacco Use    Smoking status: Never    Smokeless tobacco: Never   Substance and Sexual Activity    Alcohol use: Yes     Alcohol/week: 1.0 standard drink of alcohol     Types: 1 Drinks containing 0.5 oz of alcohol per week    Drug use: Never    Sexual activity: Yes     Partners: Male   Other Topics Concern    Not on file   Social History Narrative    Not on file     Social Drivers of Health     Financial Resource Strain: Low Risk  (04/25/2023)    Overall Financial Resource Strain (CARDIA)     Difficulty of Paying Living Expenses: Not hard at all   Food Insecurity: No Food Insecurity (01/02/2024)    Hunger Vital Sign     Worried About Running Out of Food in the Last Year: Never true     Ran Out of Food in the Last Year: Never true   Transportation Needs: No Transportation Needs (01/02/2024)    PRAPARE - Therapist, art (Medical): No     Lack of Transportation (Non-Medical): No   Physical Activity: Insufficiently Active (07/19/2022)    Exercise Vital Sign     Days of Exercise per Week: 2 days     Minutes of Exercise per Session: 60 min   Stress: Not on file   Social Connections: Not on file   Intimate Partner Violence: Not on file   Housing Stability: Low Risk  (01/02/2024)    Housing Stability Vital Sign      Unable to Pay for Housing in the Last Year: No     Number of Times Moved in the Last Year: 0     Homeless in the Last Year: No      Current Outpatient Medications   Medication Sig Dispense Refill    vitamin D  (ERGOCALCIFEROL ) 1.25 MG (50000 UT) CAPS capsule TAKE 1 CAPSULE BY MOUTH 1 TIME A WEEK 12 capsule 1    rosuvastatin  (CRESTOR ) 10 MG tablet Take 1 tablet by mouth daily 90 tablet 2    aspirin -acetaminophen -caffeine  (EXCEDRIN  MIGRAINE) 250-250-65 MG per tablet Take 1 tablet by mouth every 12 hours as needed for Headaches 60 tablet 2     No current facility-administered medications for this visit.        ROS   Review of Systems   Constitutional:  Negative for chills and fever.   Respiratory:  Negative for shortness of breath.    Cardiovascular:  Negative for chest pain.   Gastrointestinal:  Negative for abdominal pain.   Neurological:  Negative for dizziness, light-headedness and headaches.   Psychiatric/Behavioral:  Negative for dysphoric mood.          Physical Exam  Vitals:    01/02/24 0826   BP: 105/67   BP Site: Left Upper Arm   Patient Position: Sitting   BP Cuff Size: Small Adult   Pulse: 77   Resp: 15   Temp: 97.3 F (36.3 C)   TempSrc: Temporal   SpO2: 98%   Weight: 59.9 kg (132 lb)   Height: 1.6 m (5' 3)     General: alert, oriented, not in distress  Chest/Lungs: breathing comfortably on room air  Heart: normal rate  Extremities: no focal deformities, no edema  Skin: no active skin lesions  Pelvic exam: normal external genitalia, vulva, vagina, cervix, uterus and adnexa, PAP: Pap smear done today.      Assessment/Plan:    Cassandra George was seen today for gynecologic exam.    Diagnoses and all orders for this visit:    Encounter for gynecological examination with Papanicolaou smear of cervix  -     PAP IG, Aptima HPV and rfx 16/18,45 ([id number]); Future  -     C.trachomatis N.gonorrhoeae DNA; Future  -     Bacterial Vaginosis Panel; Future  -     Candida Vaginitis and Trichomonas Vaginalis Amplification;  Future          On this date 01/02/2024 I have spent 20 minutes reviewing previous notes, test results and face to face with the patient discussing the diagnosis and importance of compliance with the treatment plan as well as documenting on the day of the visit.    I have discussed the diagnosis with the patient and the intended plan as seen in the above orders.  The patient has received an after-visit summary and questions were answered concerning future plans.  I have discussed medication side effects and warnings with the patient as well. I have reviewed the plan of care with the patient, accepted their input and they are in agreement with the treatment goals.     Follow-up and Dispositions    Return in about 3 months (around 04/03/2024) for lipid panel .       Marko JINNY Naegeli, MD   January 02, 2024

## 2024-01-02 NOTE — Progress Notes (Signed)
 Cassandra George is a 43 y.o. year old female who presents today for   Chief Complaint   Patient presents with    Gynecologic Exam        "Have you been to the ER, urgent care clinic since your last visit?  Hospitalized since your last visit?"   NO     "Have you seen or consulted any other health care providers outside our system since your last visit?"   NO             - Makaiyah Schweiger/MA  [employer]  Phone: [phone]  Fax: [phone]

## 2024-01-03 LAB — NUSWAB VAGINITIS PLUS (VG+)

## 2024-01-10 LAB — PAP (IMAGE GUIDED), LIQUID-BASED (193000)

## 2024-01-10 LAB — HPV, COBAS HIGH-RISK/16/18
HPV, Genotype 16: NEGATIVE
HPV, Genotype 18: NEGATIVE
HPV, High Risk: NEGATIVE

## 2024-02-17 NOTE — ED Provider Notes (Signed)
 [facility] EMERGENCY DEPARTMENT    Time of Arrival:   02/17/24 0004        Final diagnoses:   [O20.9] Vaginal bleeding affecting early pregnancy (Primary)       Medical Decision Making:      Differential/Questionable Diagnosis:   Ectopic, miscarriage, abortion, tubo-ovarian abscess, torsion, fibroids                                                       ED Course:     ED Course as of 02/17/24 0314   Mon Feb 17, 2024   [id number] 43 year old female light vaginal bleeding.  Labs reassuring no large acute blood loss no signs of anemia.  Her beta is downtrending from 45,000-10,000, patient has not had a initial ultrasound, she approximately 7 weeks.  Ultrasound reviewed and my pulmonary read does appear to have an intrauterine pregnancy, although gestational sac does appear to be irregular, potentially going through miscarriage.  Consider OB/GYN consult but no emergent indication at this time, she will need to follow-up with her OB/GYN urgently in office if she cannot get a timely appointment will recommend to come back within the next 48 to 72 hours for recheck/trending/repeat ultrasound [CL]                            Documentation/Prior Results Review : See ED Course above, Initial ED Provider Note, Old medical records:  Reviewed OB/GYN office visit note 02/13/2024, also reviewed primary care office visit note gynecological problem 01/02/2024        Image interpretations by me -  : As documented in ED Course    US  OB COMPLETE,14 WKS W/TV INC DOPPLER   Final Result   Intrauterine pregnancy at about 7 weeks 2 days of gestation. Irregular gestational sac and fetal pole with large heart and irregular heartbeat. Further evaluation with Maternal - Fetal Medicine recommended.      Signed By: Candyce LULLA Miyamoto, MD on 02/17/2024 2:56 AM             .     Social drivers : social factors reviewed, did not limit treatment     Discussion of Management with other Physicians, QHP or Appropriate Source; : None    .    Disposition:   Home    Discharge Medication List as of 02/17/2024  3:03 AM        Chief Complaint   Patient presents with   . VAGINAL BLEED; PREGNANT       43 year old female is coming in [redacted] weeks pregnant is coming in with bleeding that she noticed earlier tonight some dark blood she has not bled through her pad yet.  No abdominal pain no cramping.  Patient does have a history of an ectopic pregnancy.  She does have an OB/GYN recent beta check has been overall normal but has not had an ultrasound yet.                Review of Systems:  Constitutional:  Negative for chills.   HENT:  Negative for neck pain.    Eyes:  Negative for photophobia.   Respiratory:  Negative for cough and shortness of breath.    Cardiovascular:  Negative for chest pain.   Gastrointestinal:  Negative for  nausea, vomiting and abdominal pain.   Genitourinary:  Positive for vaginal bleeding. Negative for dysuria.   Skin:  Negative for rash.   Neurological:  Negative for headaches.   Hematological:  does not bruise/bleed easily.   Psychiatric/Behavioral:  Negative for behavioral problems and agitation.        Physical Exam  Constitutional:       General: She is not in acute distress.     Appearance: Normal appearance.   HENT:      Head: Normocephalic.      Right Ear: Tympanic membrane normal.      Left Ear: Tympanic membrane normal.      Nose: Nose normal.      Mouth/Throat:      Mouth: Mucous membranes are moist.   Cardiovascular:      Rate and Rhythm: Normal rate and regular rhythm.      Pulses: Normal pulses.      Heart sounds: Normal heart sounds.   Pulmonary:      Effort: Pulmonary effort is normal.      Breath sounds: Normal breath sounds.   Abdominal:      Palpations: Abdomen is soft.      Comments: Normal abdomen exam,  bowel sounds normal, soft, no tenderness, no masses, no pulsatile masses. No rebound or organomegly.   Musculoskeletal:         General: Normal range of motion.      Cervical back: Normal range of motion.   Skin:     General: Skin is warm.       Capillary Refill: Capillary refill takes less than 2 seconds.      Findings: No rash.   Neurological:      General: No focal deficit present.      Mental Status: She is alert.      Gait: Gait normal.         Past Medical History:   Diagnosis Date   . Pelvic pain      Past Surgical History:   Procedure Laterality Date   . HYSTEROSCOPY N/A 12/29/2020    Procedure: HYSTEROSCOPY WITH SAMPLING OF ENDOMETRIUM; DILATION & CURETTAGE;  Surgeon: Vinita KANDICE Ricardo Candiss', MD   . LAPAROSCOPY N/A 12/29/2020    Procedure: LAPAROSCOPY, DIAGNOSTIC;  Surgeon: Vinita KANDICE Ricardo Candiss', MD   . OTHER  2020    ECTOPIC PREGNANCY-LAPAROSCOPIC   . OTHER Bilateral 2020    WISDOM TEETH   . OVIDUCT CHROMOTUBATION N/A 12/29/2020    Procedure: BRUNA BENEDICT;  Surgeon: Vinita KANDICE Ricardo Candiss', MD     No family history on file.  Social History     Occupational History   . Not on file   Tobacco Use   . Smoking status: Never   . Smokeless tobacco: Never   Vaping Use   . Vaping status: Never Used   Substance and Sexual Activity   . Alcohol use: Never   . Drug use: Never   . Sexual activity: Not on file     Outpatient Medications Marked as Taking for the 02/17/24 encounter [facility] Encounter)   Medication Sig Dispense Refill   . acetaminophen  (TYLENOL ) 325 mg PO TABS Take 1 Tab by Mouth Every 4 Hours As Needed.       No Known Allergies    Vital Signs:  Patient Vitals for the past 72 hrs:   Temp Heart Rate Pulse Resp BP BP Mean SpO2 Weight   02/17/24 0020 -- -- -- -- -- -- --  61.2 kg (135 lb)   02/17/24 0012 98.1 F (36.7 C) -- 84 -- 113/60 81 MM HG 100 % --   02/17/24 0010 -- 98 -- 18 -- -- 100 % --       Diagnostics:  Labs:    Results for orders placed or performed during the hospital encounter of 02/17/24   BASIC METABOLIC PANEL   Result Value Ref Range    Potassium 4.3 3.5 - 5.5 mmol/L    Sodium 133 133 - 145 mmol/L    Chloride 104 98 - 110 mmol/L    Glucose 92 70 - 99 mg/dL    Calcium  8.9 8.4 - 10.5 mg/dL    BUN 8 6 - 22 mg/dL     Creatinine 0.5 0.5 - 1.2 mg/dL    CO2 19 (L) 20 - 32 mmol/L    eGFR >90.0 >60.0 mL/min/1.73 sq.m.    Anion Gap 10.0 3.0 - 15.0 mmol/L   BETA HCG BLOOD (QUANTITATIVE)   Result Value Ref Range    Beta HCG 10,000 mIU/mL   CBC WITH DIFFERENTIAL AUTO   Result Value Ref Range    WBC 7.5 4.0 - 11.0 K/uL    RBC 4.56 3.80 - 5.20 M/uL    HGB 13.1 11.7 - 15.5 g/dL    HCT 59.5 64.8 - 53.4 %    MCV 89 80 - 99 fL    MCH 29 26 - 34 pg    MCHC 32 31 - 36 g/dL    RDW 87.1 89.9 - 84.4 %    Platelet 276 140 - 440 K/uL    MPV 9.0 9.0 - 13.0 fL    Segmented Neutrophils (Auto) 74 40 - 75 %    Lymphocytes (Auto) 18 (L) 20 - 45 %    Monocytes (Auto) 7 3 - 12 %    Eosinophils (Auto) 1 0 - 6 %    Basophils (Auto) 1 0 - 2 %    Absolute Neutrophils (Auto) 5.6 1.8 - 7.7 K/uL    Absolute Lymphocytes (Auto) 1.3 1.0 - 4.8 K/uL    Absolute Monocytes (Auto) 0.5 0.1 - 1.0 K/uL    Absolute Eosinophils (Auto) 0.1 0.0 - 0.5 K/uL    Absolute Basophils (Auto) 0.0 0.0 - 0.2 K/uL   Urinalysis w Micro Reflex Culture    Specimen: Clean Catch Urine   Result Value Ref Range    Source Urine      Urine Color Yellow Colorless, Pale Yellow, Light Yellow, Yellow, Dark Yellow, Straw    Urine Clarity Clear Clear, Slightly Cloudy    Urine pH 6.5 5.0 - 8.0 pH    Urine Protein Screen Trace Negative, Trace mg/dL    Urine Glucose Negative Negative mg/dL    Urine Ketones Negative Negative mg/dL    Urine Occult Blood Large (A) Negative    Urine Specific Gravity 1.011 1.005 - 1.030    Urine Nitrite Negative Negative    Urine Leukocyte Esterase Negative Negative    Urine Bilirubin Negative Negative    Urine Urobilinogen 0.2 <2.0 mg/dL mg/dL    Urine RBC 3-89 (A) Negative, 0-2 /hpf    Urine WBC 0-2 0 - 5 /hpf    Urine Bacteria Negative Negative    Squamous Epithelial Cells 0-2 None, 0-2 /hpf    Hyaline Cast 0-2 0 - 2 /lpf     ECG:  No results found for this visit on 02/17/24.         Medications ordered/given in the  ED  Medications - No data to display

## 2024-02-21 LAB — HEMOGLOBIN A1C
Estimated Avg Glucose, External: 106 mg/dL (ref 91–123)
Hemoglobin A1C, External: 5.3 % (ref 4.8–5.6)

## 2024-04-06 ENCOUNTER — Ambulatory Visit
Admit: 2024-04-06 | Discharge: 2024-04-06 | Payer: PRIVATE HEALTH INSURANCE | Attending: Student in an Organized Health Care Education/Training Program | Primary: Student in an Organized Health Care Education/Training Program

## 2024-04-06 ENCOUNTER — Inpatient Hospital Stay
Admit: 2024-04-07 | Payer: PRIVATE HEALTH INSURANCE | Primary: Student in an Organized Health Care Education/Training Program

## 2024-04-06 VITALS — BP 113/74 | HR 80 | Temp 97.20000°F | Resp 16 | Ht 63.0 in | Wt 133.0 lb

## 2024-04-06 DIAGNOSIS — E782 Mixed hyperlipidemia: Principal | ICD-10-CM

## 2024-04-06 NOTE — Progress Notes (Signed)
 History of Present Illness  Cassandra George is a 43 y.o. female who presents today for management of    Chief Complaint   Patient presents with    Follow-up    Cholesterol Problem         History of Present Illness  The patient presents for a follow-up visit.    She reports a miscarriage at the end of 02/2024, which occurred around the 10th week of her pregnancy. This pregnancy was unplanned, as she had previously been informed by her doctor that she would not be able to conceive. During her 10-week appointment, it was discovered that the fetus was not developing, leading to a dilation and curettage procedure. She has an upcoming appointment with her OB-GYN later this week and has expressed her desire to attempt conception again. Her OB-GYN advised her to contact them when her menstrual cycle begins. Currently, she is taking folic acid supplements.    She has been diagnosed with high cholesterol and is currently on medication for it. However, she discontinued the medication for about a month during her pregnancy.    She received her influenza vaccine. She is immune to varicella from her labs. She is not immune to hepatitis B.     PAST SURGICAL HISTORY:  Dilation and curettage (02/2024)      Patient Active Problem List   Diagnosis    Vitamin D  deficiency    Mixed hyperlipidemia      History reviewed. No pertinent past medical history.   History reviewed. No pertinent surgical history.   History reviewed. No pertinent family history.  Social History     Socioeconomic History    Marital status: Single     Spouse name: Not on file    Number of children: Not on file    Years of education: Not on file    Highest education level: Not on file   Occupational History    Not on file   Tobacco Use    Smoking status: Never    Smokeless tobacco: Never   Substance and Sexual Activity    Alcohol use: Yes     Alcohol/week: 1.0 standard drink of alcohol    Drug use: Never    Sexual activity: Yes     Partners: Male   Other Topics  Concern    Not on file   Social History Narrative    Not on file     Social Drivers of Health     Financial Resource Strain: Low Risk  (04/25/2023)    Overall Financial Resource Strain (CARDIA)     Difficulty of Paying Living Expenses: Not hard at all   Food Insecurity: No Food Insecurity (01/02/2024)    Hunger Vital Sign     Worried About Running Out of Food in the Last Year: Never true     Ran Out of Food in the Last Year: Never true   Transportation Needs: No Transportation Needs (01/02/2024)    PRAPARE - Therapist, art (Medical): No     Lack of Transportation (Non-Medical): No   Physical Activity: Insufficiently Active (07/19/2022)    Exercise Vital Sign     Days of Exercise per Week: 2 days     Minutes of Exercise per Session: 60 min   Stress: Not on file   Social Connections: Not on file   Intimate Partner Violence: Not on file   Housing Stability: Low Risk  (01/02/2024)    Housing Stability Vital  Sign     Unable to Pay for Housing in the Last Year: No     Number of Times Moved in the Last Year: 0     Homeless in the Last Year: No      Current Outpatient Medications   Medication Sig Dispense Refill    vitamin D  (ERGOCALCIFEROL ) 1.25 MG (50000 UT) CAPS capsule TAKE 1 CAPSULE BY MOUTH 1 TIME A WEEK 12 capsule 1    rosuvastatin  (CRESTOR ) 10 MG tablet Take 1 tablet by mouth daily 90 tablet 2    aspirin -acetaminophen -caffeine  (EXCEDRIN  MIGRAINE) 250-250-65 MG per tablet Take 1 tablet by mouth every 12 hours as needed for Headaches 60 tablet 2     No current facility-administered medications for this visit.        ROS   Review of Systems      Physical Exam  Vitals:    04/06/24 0807   BP: 113/74   Pulse: 80   Resp: 16   Temp: 97.2 F (36.2 C)   TempSrc: Temporal   SpO2: 96%   Weight: 60.3 kg (133 lb)   Height: 1.6 m (5' 3)     General: alert, oriented, not in distress  Chest/Lungs: breathing comfortably on room air  Heart: normal rate  Extremities: no focal deformities, no edema  Skin: no  active skin lesions    Assessment & Plan  Assessment/Plan:    [name] was seen today for follow-up and cholesterol problem.    Diagnoses and all orders for this visit:    Mixed hyperlipidemia  - Her cholesterol levels were previously elevated.  - She has been on cholesterol medication for about 3 months but stopped during her pregnancy. A lipid panel will be ordered today to assess current levels.  - If she becomes pregnant again, the cholesterol medication will be paused during the pregnancy.  -     Lipid Panel; Future    Miscarriage at 8 to [redacted] weeks gestation  Hx of dilation and curettage  - She is currently taking folic acid and has an upcoming appointment with her OB-GYN to discuss future pregnancy plans.     Need for vaccination  -     Influenza, FLUCELVAX Trivalent, (age 66 mo+) IM, Preservative Free, 0.5mL  -     Hep B, HEPLISAV-B, (age 17 yrs+), IM, 0.5mL, 2-Dose      On this date 04/06/2024 I have spent 20 minutes reviewing previous notes, test results and face to face with the patient discussing the diagnosis and importance of compliance with the treatment plan as well as documenting on the day of the visit.    I have discussed the diagnosis with the patient and the intended plan as seen in the above orders.  The patient has received an after-visit summary and questions were answered concerning future plans.  I have discussed medication side effects and warnings with the patient as well. I have reviewed the plan of care with the patient, accepted their input and they are in agreement with the treatment goals.       The patient (or guardian, if applicable) and other individuals in attendance with the patient were advised that Artificial Intelligence will be utilized during this visit to record, process the conversation to generate a clinical note, and support improvement of the AI technology. The patient (or guardian, if applicable) and other individuals in attendance at the appointment consented to the use of  AI, including the recording.      Follow-up  and Dispositions    Return in about 4 months (around 08/06/2024) for Annual Physical Exam.           Marko JINNY Naegeli, MD   April 06, 2024

## 2024-04-07 LAB — SENTARA SPECIMEN COLLECTION

## 2024-04-07 LAB — LIPID PANEL
Chol/HDL Ratio: 3.8 (ref 0.0–5.0)
Cholesterol, Total: 149 mg/dL (ref 110–200)
HDL: 39 mg/dL — ABNORMAL LOW (ref >=40)
LDL Cholesterol: 99 mg/dL (ref 50–99)
LDL/HDL Ratio: 2.6
Non-HDL Cholesterol: 110 mg/dL (ref <130)
Triglycerides: 52 mg/dL (ref 40–149)
VLDL Cholesterol Calculated: 10 mg/dL (ref 8–30)

## 2024-06-15 MED ORDER — VITAMIN D (ERGOCALCIFEROL) 1.25 MG (50000 UT) PO CAPS
1.25 | ORAL_CAPSULE | ORAL | 1 refills | 83.00000 days | Status: AC
Start: 2024-06-15 — End: ?

## 2024-06-16 ENCOUNTER — Ambulatory Visit
Admit: 2024-06-16 | Discharge: 2024-06-16 | Payer: PRIVATE HEALTH INSURANCE | Primary: Student in an Organized Health Care Education/Training Program

## 2024-06-16 VITALS — BP 125/73 | HR 95 | Temp 97.90000°F | Resp 16 | Ht 63.0 in | Wt 138.0 lb

## 2024-06-16 DIAGNOSIS — M545 Low back pain, unspecified: Principal | ICD-10-CM

## 2024-06-16 MED ORDER — CYCLOBENZAPRINE HCL 5 MG PO TABS
5 | ORAL_TABLET | Freq: Two times a day (BID) | ORAL | 0 refills | 10.00000 days | Status: AC | PRN
Start: 2024-06-16 — End: 2024-06-26

## 2024-06-16 NOTE — Progress Notes (Signed)
 "[facility]  DATE OF VISIT : 06/16/24     PATIENT:Cassandra George    AGE:43 y.o.     DOB:[birth year]      FMW:[id number]   ___________________________________________________________________________________________________________________________________________________    ASSESSMENT AND PLAN    Acute bilateral low back pain without sciatica  Present for three weeks. Physical exam pertinent for right straight leg raise positive and lower back pain that worsens with spinal flexion yet relieved with spinal extension.  Order images; XR LUMBAR SPINE (MIN 4 VIEWS); Future  Prescribed cyclobenzaprine  (FLEXERIL ) 5 MG tablet; Take 1 tablet by mouth 2 times daily as needed for Muscle spasms  Dispense: 10 tablet  Recommend continue Ibuprofen, Flexeril , and low back exercises/stretches.   Pt advised to notify office if new alarming symptoms ensue. Pt understood and agreed with plan.      Return to Office: Return if symptoms worsen or fail to improve.     I have discussed the diagnosis with the patient and the intended plan as seen in the above orders. The patient has received an after visit summary and all questions regarding future plans have been answered. I have discussed medication side effects and warnings with the patient. I have reviewed the plan of care with the patient, accepted their input, and they are in agreement with the treatment goals.     Marsa SHAUNNA Chough, MD  __________________________________________________________________________________________________________________________________________________    CHIEF COMPLAINT  Chief Complaint   Patient presents with    Back Pain     Patient c/o lower back pain x1 month      HPI:  History obtained from the patient. Cassandra George  is a 43 y.o.  female who presents to clinic with complaints of back pain.  Patient reports she is experiencing low back pain for the last 3 weeks.  The pain is described as a sharp sensation that is aggravated  with positional changes, specifically from laying down to sitting up and with sitting down for excessive periods of time.  Pain is mildly alleviated with increased physical activity including walking.  Patient denies any alarming associated symptoms such as saddle anesthesia, bowel incontinence, urinary incontinence, or difficulty ambulation.  Since onset, the patient has been utilizing Tylenol  and ibuprofen which has offered little relief.  Pain has been gradually improving on its own.  Patient denies any history of trauma, surgical procedures, kidney stones, or UTI-like symptoms.  __________________________________________________________________________________________________________________________________________________    PAST MEDICAL HISTORY  History reviewed. No pertinent past medical history.     SURGICAL HISTORY  History reviewed. No pertinent surgical history.     FAMILY HISTORY  History reviewed. No pertinent family history.     SOCIAL HISTORY  Social Connections: Not on file        ALLERGIES  No Known Allergies     CURRENT MEDICATIONS  Current Outpatient Medications   Medication Sig Dispense Refill    cyclobenzaprine  (FLEXERIL ) 5 MG tablet Take 1 tablet by mouth 2 times daily as needed for Muscle spasms 10 tablet 0    vitamin D  (ERGOCALCIFEROL ) 1.25 MG (50000 UT) CAPS capsule TAKE 1 CAPSULE BY MOUTH 1 TIME A WEEK 12 capsule 1    rosuvastatin  (CRESTOR ) 10 MG tablet Take 1 tablet by mouth daily 90 tablet 2    aspirin -acetaminophen -caffeine  (EXCEDRIN  MIGRAINE) 250-250-65 MG per tablet Take 1 tablet by mouth every 12 hours as needed for Headaches 60 tablet 2     No current facility-administered medications for this visit.  REVIEW OF SYSTEMS  All negative unless specified in the HPI.     PHYSICAL EXAM  BP 125/73   Pulse 95   Temp 97.9 F (36.6 C) (Temporal)   Resp 16   Ht 1.6 m (5' 3)   Wt 62.6 kg (138 lb)   SpO2 99%   BMI 24.45 kg/m      Physical Exam  Vitals and nursing note reviewed.    Constitutional:       Appearance: Normal appearance. She is normal weight.   HENT:      Head: Normocephalic and atraumatic.      Right Ear: External ear normal.      Left Ear: External ear normal.      Nose: Nose normal.      Mouth/Throat:      Mouth: Mucous membranes are moist.      Pharynx: Oropharynx is clear.   Eyes:      Extraocular Movements: Extraocular movements intact.      Conjunctiva/sclera: Conjunctivae normal.      Pupils: Pupils are equal, round, and reactive to light.   Cardiovascular:      Rate and Rhythm: Normal rate and regular rhythm.      Pulses: Normal pulses.      Heart sounds: Normal heart sounds.   Pulmonary:      Effort: Pulmonary effort is normal.      Breath sounds: Normal breath sounds.   Abdominal:      General: Abdomen is flat. Bowel sounds are normal.      Palpations: Abdomen is soft.   Musculoskeletal:         General: Tenderness present. No swelling, deformity or signs of injury.      Cervical back: Normal, normal range of motion and neck supple.      Thoracic back: Normal.      Lumbar back: Tenderness present. No swelling, edema, deformity, signs of trauma, lacerations, spasms or bony tenderness. Normal range of motion. Positive right straight leg raise test. Negative left straight leg raise test. No scoliosis.      Right lower leg: No edema.      Left lower leg: No edema.   Skin:     General: Skin is warm.   Neurological:      General: No focal deficit present.      Mental Status: She is alert and oriented to person, place, and time. Mental status is at baseline.   Psychiatric:         Mood and Affect: Mood normal.         Behavior: Behavior normal.         Thought Content: Thought content normal.         Judgment: Judgment normal.         "

## 2024-06-16 NOTE — Progress Notes (Signed)
"  LEN KLUVER is a 43 y.o. year old female who presents today for   Chief Complaint   Patient presents with    Back Pain     Patient c/o lower back pain x1 month, pain does occasionally radiate down both legs.        Have you been to the ER, urgent care clinic since your last visit?  Hospitalized since your last visit?   No     Have you seen or consulted any other health care providers outside our system since your last visit?   No                -Coleen Collum, RMA  [facility]  Phone: [phone]  Fax: [phone]  "

## 2024-08-06 ENCOUNTER — Encounter
Payer: PRIVATE HEALTH INSURANCE | Attending: Student in an Organized Health Care Education/Training Program | Primary: Student in an Organized Health Care Education/Training Program

## 2024-08-19 ENCOUNTER — Encounter

## 2024-08-19 ENCOUNTER — Ambulatory Visit
Admit: 2024-08-19 | Payer: PRIVATE HEALTH INSURANCE | Attending: Student in an Organized Health Care Education/Training Program | Primary: Student in an Organized Health Care Education/Training Program

## 2024-08-19 DIAGNOSIS — Z8759 Personal history of other complications of pregnancy, childbirth and the puerperium: Principal | ICD-10-CM

## 2024-08-19 MED ORDER — ROSUVASTATIN CALCIUM 10 MG PO TABS
10 | ORAL_TABLET | Freq: Every day | ORAL | 2 refills | Status: AC
Start: 2024-08-19 — End: ?

## 2024-08-19 NOTE — Progress Notes (Signed)
 Cassandra George is a 44 y.o. year old female who presents today for   Chief Complaint   Patient presents with    Annual Exam       Have you been to the ER, urgent care clinic since your last visit?  Hospitalized since your last visit?    NO    Have you seen or consulted any other health care providers outside our system since your last visit?    NO          Click Here for Release of Records Request    Cassandra George  [facility]  [address]  [place], [address]  Ph: [phone]  Fax: [phone]

## 2024-08-20 NOTE — Progress Notes (Signed)
 History of Present Illness  Cassandra George is a 44 y.o. female who presents today for management of    Chief Complaint   Patient presents with    Other     Had surgery last week for hysterectomy.        History of Present Illness  The patient presents for a follow-up after her hysteroscopy.    She recently underwent a hysteroscopy, during which polyps and endometriosis lesions were removed. No pain has been experienced following the procedure. Her menstrual cycle resumed on Monday. She expresses anxiety about attempting to conceive again, fearing another miscarriage. She and her husband have considered adoption or fostering, but he is now keen on trying for a biological child. She does not feel pressured by him, but acknowledges self-imposed pressure. Her primary concern is her age. She is determined to try conceiving again, but is apprehensive about potential negative outcomes. Her Anti-Mullerian hormone (AMH) levels were tested and found to be low. She has not been prescribed any medication to prevent miscarriage.    She received a vaccine during her last visit, which was not covered by her insurance, resulting in an out-of-pocket expense of approximately $300. She is uncertain about how to proceed with payment for the second dose.    GYNECOLOGICAL HISTORY:  Last Menstrual Period: 08/12/2024    PAST SURGICAL HISTORY:  Hysteroscopy with removal of polyps and endometriosis lesions      Patient Active Problem List   Diagnosis    Vitamin D  deficiency    Mixed hyperlipidemia      History reviewed. No pertinent past medical history.   History reviewed. No pertinent surgical history.   History reviewed. No pertinent family history.  Social History     Socioeconomic History    Marital status: Single     Spouse name: Not on file    Number of children: Not on file    Years of education: Not on file    Highest education level: Not on file   Occupational History    Not on file   Tobacco Use    Smoking status: Never     Smokeless tobacco: Never   Substance and Sexual Activity    Alcohol use: Yes     Alcohol/week: 1.0 standard drink of alcohol    Drug use: Never    Sexual activity: Yes     Partners: Male   Other Topics Concern    Not on file   Social History Narrative    Not on file     Social Drivers of Health     Financial Resource Strain: Low Risk  (04/25/2023)    Overall Financial Resource Strain (CARDIA)     Difficulty of Paying Living Expenses: Not hard at all   Food Insecurity: No Food Insecurity (01/02/2024)    Hunger Vital Sign     Worried About Running Out of Food in the Last Year: Never true     Ran Out of Food in the Last Year: Never true   Transportation Needs: No Transportation Needs (01/02/2024)    PRAPARE - Therapist, Art (Medical): No     Lack of Transportation (Non-Medical): No   Physical Activity: Insufficiently Active (07/19/2022)    Exercise Vital Sign     Days of Exercise per Week: 2 days     Minutes of Exercise per Session: 60 min   Stress: Not on file   Social Connections: Not on file   Intimate  Partner Violence: Not on file   Housing Stability: Low Risk  (01/02/2024)    Housing Stability Vital Sign     Unable to Pay for Housing in the Last Year: No     Number of Times Moved in the Last Year: 0     Homeless in the Last Year: No      Current Outpatient Medications   Medication Sig Dispense Refill    rosuvastatin  (CRESTOR ) 10 MG tablet TAKE 1 TABLET BY MOUTH DAILY 90 tablet 2    vitamin D  (ERGOCALCIFEROL ) 1.25 MG (50000 UT) CAPS capsule TAKE 1 CAPSULE BY MOUTH 1 TIME A WEEK 12 capsule 1    aspirin -acetaminophen -caffeine  (EXCEDRIN  MIGRAINE) 250-250-65 MG per tablet Take 1 tablet by mouth every 12 hours as needed for Headaches 60 tablet 2     No current facility-administered medications for this visit.        ROS   Review of Systems   Constitutional:  Negative for chills and fever.   Respiratory:  Negative for shortness of breath.    Cardiovascular:  Negative for chest pain.    Gastrointestinal:  Negative for abdominal pain.   Neurological:  Negative for dizziness, light-headedness and headaches.   Psychiatric/Behavioral:  Negative for dysphoric mood.          Physical Exam  Vitals:    08/19/24 1440   BP: 107/67   Pulse: 81   Resp: 18   Temp: 98.6 F (37 C)   TempSrc: Temporal   SpO2: 99%   Weight: 63 kg (139 lb)   Height: 1.6 m (5' 3)     General: alert, oriented, not in distress  Chest/Lungs: breathing comfortably on room air  Heart: normal rate  Extremities: no focal deformities, no edema  Skin: no active skin lesions      Assessment & Plan  Assessment/Plan:    Lisvet was seen today for other.    Diagnoses and all orders for this visit:    History of miscarriage  - She is currently in a healthy state, with no comorbidities that would elevate her risk profile. The only potential risk factor is her age.  - She was counseled on the importance of maintaining a positive mindset and considering all possible outcomes before making a decision about trying to conceive again. It was discussed that if she feels she can handle the emotional impact of another miscarriage, she may choose to try again. Alternatively, if she feels it would be detrimental to her mental health, she could consider options such as adoption or fostering.    Need for vaccination  - She was advised to contact her insurance provider to inquire about coverage for the second dose of the vaccine. If her insurance covers pharmacy vaccines, she can receive the hepatitis B vaccine at a pharmacy.      Follow-up: 09/2024      On this date 08/19/2024 I have spent 30 minutes reviewing previous notes, test results and face to face with the patient discussing the diagnosis and importance of compliance with the treatment plan as well as documenting on the day of the visit.    I have discussed the diagnosis with the patient and the intended plan as seen in the above orders.  The patient has received an after-visit summary and questions  were answered concerning future plans.  I have discussed medication side effects and warnings with the patient as well. I have reviewed the plan of care with the patient, accepted their input  and they are in agreement with the treatment goals.       The patient (or guardian, if applicable) and other individuals in attendance with the patient were advised that Artificial Intelligence will be utilized during this visit to record, process the conversation to generate a clinical note, and support improvement of the AI technology. The patient (or guardian, if applicable) and other individuals in attendance at the appointment consented to the use of AI, including the recording.        Marko JINNY Naegeli, MD   August 20, 2024
